# Patient Record
Sex: Female | Born: 1990 | Race: Black or African American | Hispanic: No | Marital: Single | Smoking: Never smoker
Health system: Southern US, Community
[De-identification: ages and names within clinical notes are randomized; demographics above are authoritative.]

## PROBLEM LIST (undated history)

## (undated) DIAGNOSIS — D509 Iron deficiency anemia, unspecified: Secondary | ICD-10-CM

## (undated) DIAGNOSIS — D72819 Decreased white blood cell count, unspecified: Secondary | ICD-10-CM

## (undated) DIAGNOSIS — D649 Anemia, unspecified: Secondary | ICD-10-CM

## (undated) DIAGNOSIS — F419 Anxiety disorder, unspecified: Secondary | ICD-10-CM

## (undated) DIAGNOSIS — Z5189 Encounter for other specified aftercare: Secondary | ICD-10-CM

## (undated) DIAGNOSIS — F32A Depression, unspecified: Secondary | ICD-10-CM

## (undated) DIAGNOSIS — F329 Major depressive disorder, single episode, unspecified: Secondary | ICD-10-CM

## (undated) DIAGNOSIS — A749 Chlamydial infection, unspecified: Secondary | ICD-10-CM

## (undated) HISTORY — DX: Anemia, unspecified: D64.9

## (undated) HISTORY — DX: Depression, unspecified: F32.A

## (undated) HISTORY — DX: Major depressive disorder, single episode, unspecified: F32.9

## (undated) HISTORY — DX: Anxiety disorder, unspecified: F41.9

## (undated) HISTORY — DX: Encounter for other specified aftercare: Z51.89

## (undated) HISTORY — DX: Decreased white blood cell count, unspecified: D72.819

## (undated) HISTORY — DX: Chlamydial infection, unspecified: A74.9

## (undated) HISTORY — DX: Iron deficiency anemia, unspecified: D50.9

---
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
## 2010-06-05 IMAGING — CR DG CHEST 2V
2 series · 2 of 2 positions shown · non-contrast
Comparison: 10/05/2006

CLINICAL DATA: Fever, cough

CHEST - 2 VIEW

[view not recorded (1 of 2)]
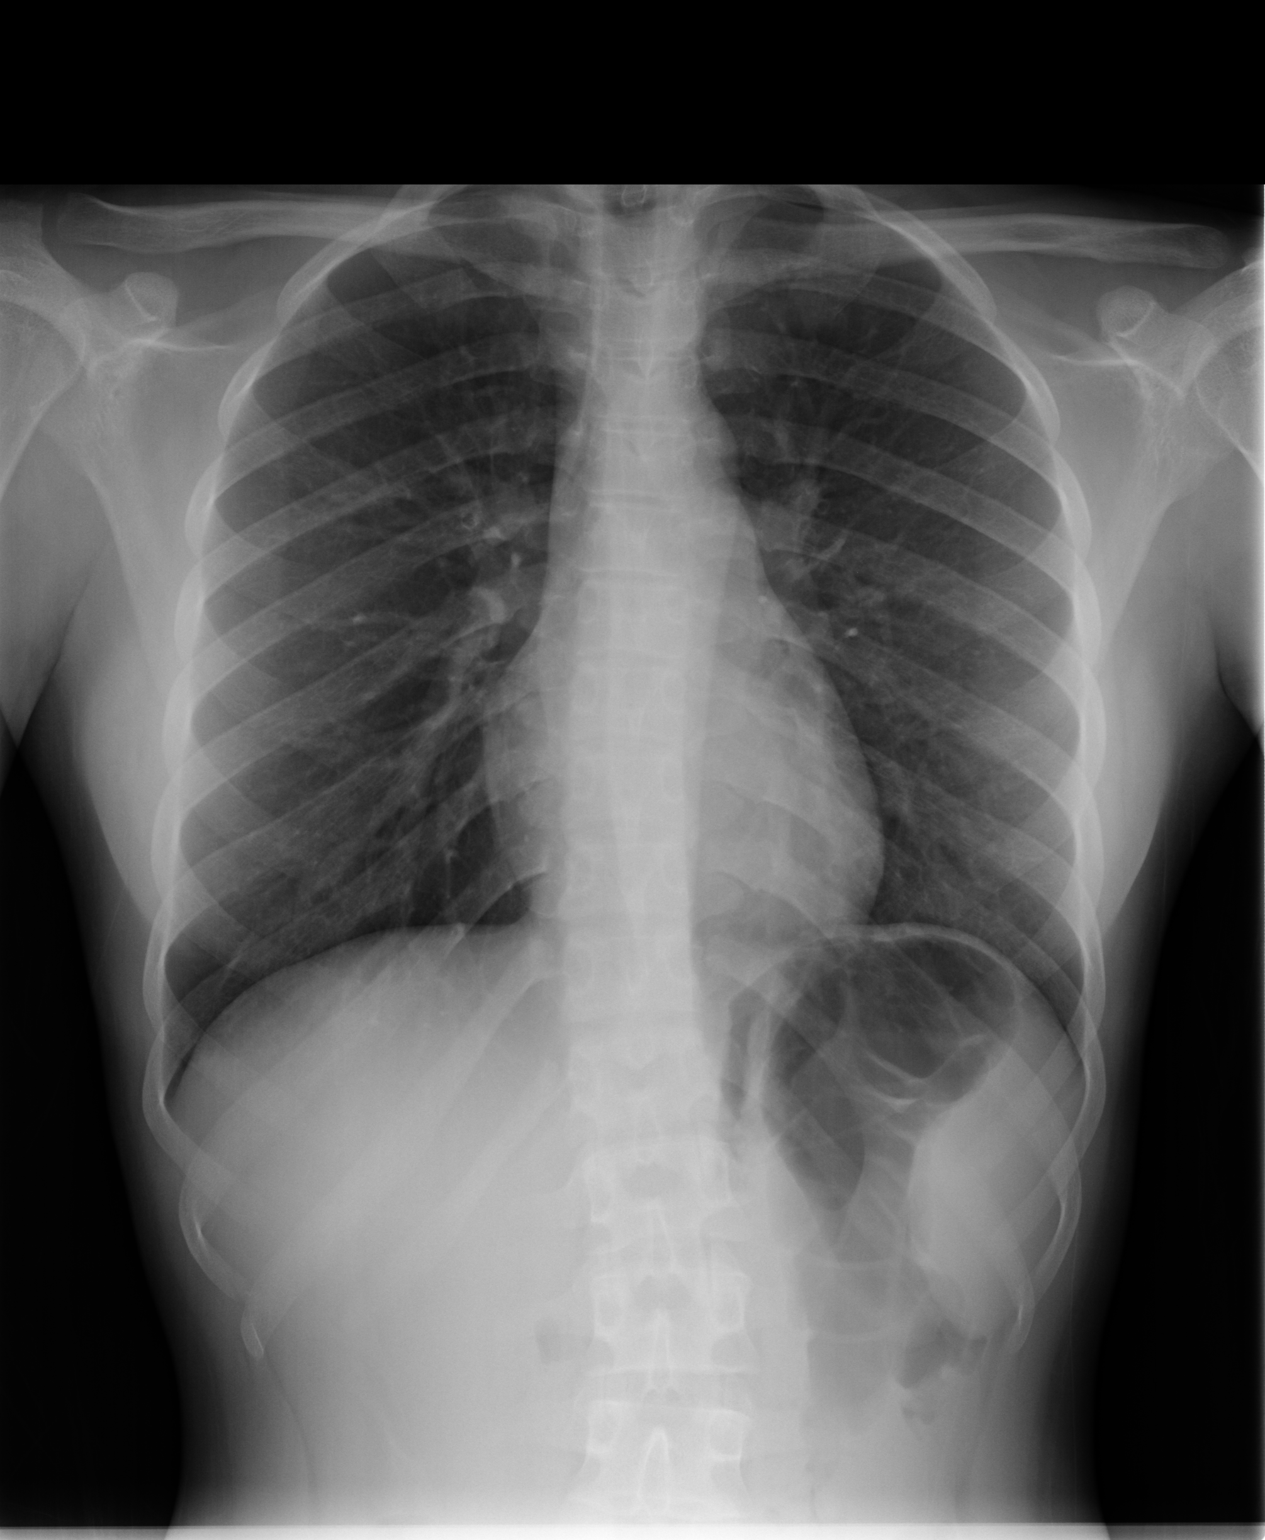

[view not recorded (2 of 2)]
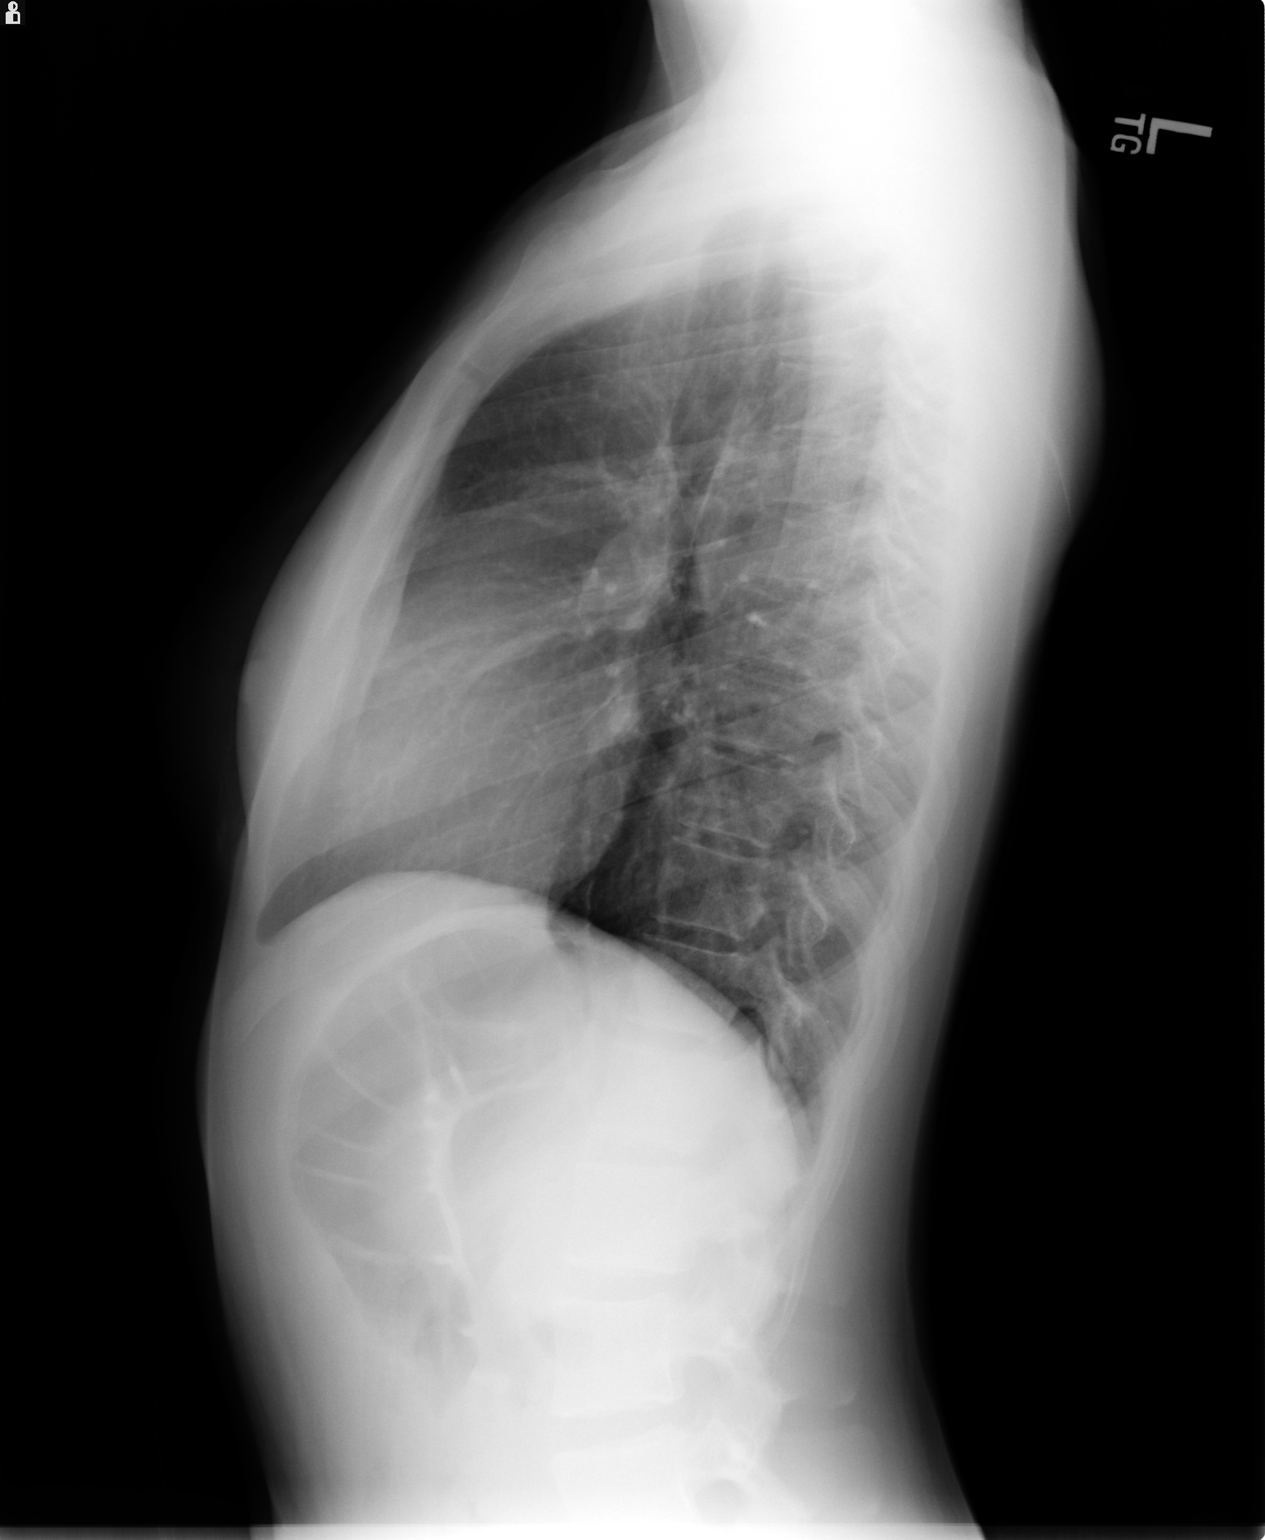

[2 of 2 positions shown; findings below may reference images not displayed]

FINDINGS: The heart size and mediastinal contours are within
normal limits.  Both lungs are clear.  The visualized skeletal
structures are unremarkable. Mild leftward curvature centered at L2
is noted which may be positional.
IMPRESSION: No active cardiopulmonary disease.

## 2011-03-10 ENCOUNTER — Observation Stay (HOSPITAL_COMMUNITY)
Admission: AD | Admit: 2011-03-10 | Discharge: 2011-03-11 | Disposition: A | Discharge status: Home / Self Care | Payer: 59 | Source: Ambulatory Visit | Attending: Family Medicine | Admitting: Family Medicine

## 2011-03-10 DIAGNOSIS — D649 Anemia, unspecified: Principal | ICD-10-CM | POA: Insufficient documentation

## 2011-03-10 DIAGNOSIS — R55 Syncope and collapse: Secondary | ICD-10-CM | POA: Insufficient documentation

## 2011-03-10 LAB — ABO/RH: ABO/RH(D): AB POS

## 2011-03-11 LAB — CROSSMATCH
ABO/RH(D): AB POS
Antibody Screen: NEGATIVE
Unit division: 0
Unit division: 0

## 2011-03-11 LAB — HEMOGLOBIN: Hemoglobin: 10.9 g/dL — ABNORMAL LOW (ref 12.0–15.0)

## 2011-03-12 DIAGNOSIS — IMO0001 Reserved for inherently not codable concepts without codable children: Secondary | ICD-10-CM

## 2011-03-12 DIAGNOSIS — Z5189 Encounter for other specified aftercare: Secondary | ICD-10-CM

## 2011-03-12 HISTORY — DX: Encounter for other specified aftercare: Z51.89

## 2011-03-12 HISTORY — DX: Reserved for inherently not codable concepts without codable children: IMO0001

## 2011-05-09 DIAGNOSIS — A749 Chlamydial infection, unspecified: Secondary | ICD-10-CM

## 2011-05-09 HISTORY — DX: Chlamydial infection, unspecified: A74.9

## 2012-03-22 ENCOUNTER — Telehealth: Payer: Self-pay | Admitting: Obstetrics and Gynecology

## 2012-03-22 ENCOUNTER — Encounter: Payer: Self-pay | Admitting: Obstetrics and Gynecology

## 2012-03-22 ENCOUNTER — Ambulatory Visit
Admission: RE | Admit: 2012-03-22 | Discharge: 2012-03-22 | Disposition: A | Discharge status: Home / Self Care | Payer: BC Managed Care – PPO | Source: Ambulatory Visit | Attending: Obstetrics and Gynecology | Admitting: Obstetrics and Gynecology

## 2012-03-22 ENCOUNTER — Ambulatory Visit (INDEPENDENT_AMBULATORY_CARE_PROVIDER_SITE_OTHER): Payer: BC Managed Care – PPO | Admitting: Obstetrics and Gynecology

## 2012-03-22 ENCOUNTER — Other Ambulatory Visit: Payer: Self-pay

## 2012-03-22 VITALS — BP 98/62 | Resp 16 | Ht 66.0 in | Wt 129.0 lb

## 2012-03-22 DIAGNOSIS — F411 Generalized anxiety disorder: Secondary | ICD-10-CM

## 2012-03-22 DIAGNOSIS — N6310 Unspecified lump in the right breast, unspecified quadrant: Secondary | ICD-10-CM

## 2012-03-22 DIAGNOSIS — N63 Unspecified lump in unspecified breast: Secondary | ICD-10-CM

## 2012-03-22 DIAGNOSIS — F419 Anxiety disorder, unspecified: Secondary | ICD-10-CM | POA: Insufficient documentation

## 2012-03-22 NOTE — Telephone Encounter (Signed)
TC from pt.  Sched  For genetic counsseling 04/20/12

## 2012-03-22 NOTE — Telephone Encounter (Signed)
TC to pt. LM to return call. Spoke with pt earlier. Agrees to genetic counseling for BRCA1 and 2.  Info given.  To schedule appt.

## 2012-03-22 NOTE — Progress Notes (Signed)
Pt c/o R breast lump since this weekend. No pain no leaking fluids per pt     S: Patient presented with hx of finding a breast lump while showering this weekend. Anxious as her mother's sister had Breast Cancer at aged 21 yrs. O: Patient has a nervous disposition and has had a life experience of her close best friend dying in a MVA 2 years ago. She has attended [facility]iatric [facility]      Taking Zoloft 50mg s po daily for depression since this event. Had attended the Dermatologist for dermatitis in the past and has been taking Vistaril 50mg s po       Daily.      Breast Examination: Visually both breast assymetrial  In appearance, No dimpling or drainage.      Examination Left Breast - negative      Examination Right Breast - 2cm lump circular in appearance on palpation  inferio- lateral to nipple Hard and mobile - non tender.      Lungs: Bilaterally Clear      CV: RRR      Abdomen: Soft and non tender all quadrants.      No other complaints. P: Patient ressured     Appt for [facility] today at 12.30hrs     [facility] Testing f/u with Harriett Sine in August.     F/U PRN     Earl Gala, CNM.

## 2012-04-20 ENCOUNTER — Ambulatory Visit: Payer: BC Managed Care – PPO

## 2012-04-20 ENCOUNTER — Telehealth: Payer: Self-pay | Admitting: Obstetrics and Gynecology

## 2012-04-20 NOTE — Telephone Encounter (Signed)
TC to pt. States forgot appt for genetic counseling.  Will call to R/S when has her calendar available.

## 2012-12-17 ENCOUNTER — Encounter (HOSPITAL_COMMUNITY): Payer: Self-pay | Admitting: Emergency Medicine

## 2012-12-17 ENCOUNTER — Other Ambulatory Visit: Payer: Self-pay | Admitting: Oncology

## 2012-12-17 ENCOUNTER — Emergency Department (HOSPITAL_COMMUNITY)
Admission: EM | Admit: 2012-12-17 | Discharge: 2012-12-18 | Disposition: A | Discharge status: Home / Self Care | Payer: BC Managed Care – PPO | Attending: Emergency Medicine | Admitting: Emergency Medicine

## 2012-12-17 DIAGNOSIS — Z79899 Other long term (current) drug therapy: Secondary | ICD-10-CM | POA: Insufficient documentation

## 2012-12-17 DIAGNOSIS — D649 Anemia, unspecified: Secondary | ICD-10-CM | POA: Insufficient documentation

## 2012-12-17 DIAGNOSIS — Z8619 Personal history of other infectious and parasitic diseases: Secondary | ICD-10-CM | POA: Insufficient documentation

## 2012-12-17 LAB — COMPREHENSIVE METABOLIC PANEL
ALT: 8 U/L (ref 0–35)
AST: 13 U/L (ref 0–37)
Albumin: 4.1 g/dL (ref 3.5–5.2)
CO2: 23 mEq/L (ref 19–32)
Chloride: 105 mEq/L (ref 96–112)
Creatinine, Ser: 0.8 mg/dL (ref 0.50–1.10)
GFR calc non Af Amer: >90 mL/min (ref >90)
Sodium: 138 mEq/L (ref 135–145)
Total Bilirubin: 0.3 mg/dL (ref 0.3–1.2)

## 2012-12-17 LAB — CBC
Platelets: 1255 10*3/uL (ref 150–400)
RBC: 3.63 MIL/uL — ABNORMAL LOW (ref 3.87–5.11)
RDW: 27 % — ABNORMAL HIGH (ref 11.5–15.5)
WBC: 5.3 10*3/uL (ref 4.0–10.5)

## 2012-12-17 MED ORDER — SODIUM CHLORIDE 0.9 % IJ SOLN: 3.0000 mL | INTRAMUSCULAR | Status: DC | PRN

## 2012-12-17 MED ORDER — SODIUM CHLORIDE 0.9 % IJ SOLN: 3.0000 mL | Freq: Two times a day (BID) | INTRAMUSCULAR | Status: DC

## 2012-12-17 MED ORDER — SODIUM CHLORIDE 0.9 % IV SOLN: 250.0000 mL | INTRAVENOUS | Status: DC | PRN

## 2012-12-17 NOTE — ED Notes (Signed)
Attempted iv x 1 without success; iv team paged

## 2012-12-17 NOTE — ED Provider Notes (Signed)
History     CSN: [id number]  Arrival date & time 12/17/12  1737   First MD Initiated Contact with Patient 12/17/12 1856      CC: Anemia  HPI Pt has history of anemia.    She thinks she normally runs at 10 or so but is not sure.  She does not see a hematologist but does have history of anemia that required a blood transfusion previously.  She is not sure what type of anemia she has. Her mother also has anemia. No history of heavy menses.  No blood in her stool.  She saw her gynecologist and had a blood test routinely last week and was called today and told to come to the ED.  No blood in her stool.  No melena.  No history of heavy menses.  Past Medical History  Diagnosis Date  . Anemia   . Chlamydia infection 05/09/11  . Blood transfusion 03/12/11    History reviewed. No pertinent past surgical history.  No family history on file.  History  Substance Use Topics  . Smoking status: Never Smoker   . Smokeless tobacco: Never Used  . Alcohol Use: 0.5 oz/week    1 drink(s) per week    OB History   Grav Para Term Preterm Abortions TAB SAB Ect Mult Living                  Review of Systems  All other systems reviewed and are negative.    Allergies  Review of patient's allergies indicates no known allergies.  Home Medications   Current Outpatient Rx  Name  Route  Sig  Dispense  Refill  . buPROPion (WELLBUTRIN SR) 150 MG 12 hr tablet   Oral   Take 450 mg by mouth every morning.         . busPIRone (BUSPAR) 10 MG tablet   Oral   Take 10 mg by mouth at bedtime.         . hydrOXYzine (VISTARIL) 25 MG capsule   Oral   Take 25 mg by mouth 3 (three) times daily as needed for itching.            BP 122/61  Pulse 85  Temp(Src) 98.8 F (37.1 C) (Oral)  Resp 14  SpO2 100%  LMP 11/30/2012  Physical Exam  Nursing note and vitals reviewed. Constitutional: She appears well-developed and well-nourished. No distress.  HENT:  Head: Normocephalic and atraumatic.   Right Ear: External ear normal.  Left Ear: External ear normal.  Eyes: Conjunctivae are normal. Right eye exhibits no discharge. Left eye exhibits no discharge. No scleral icterus.  Pale conjunctiva  Neck: Neck supple. No tracheal deviation present.  Cardiovascular: Normal rate, regular rhythm and intact distal pulses.   Pulmonary/Chest: Effort normal and breath sounds normal. No stridor. No respiratory distress. She has no wheezes. She has no rales.  Abdominal: Soft. Bowel sounds are normal. She exhibits no distension. There is no tenderness. There is no rebound and no guarding.  Musculoskeletal: She exhibits no edema and no tenderness.  Neurological: She is alert. She has normal strength. No sensory deficit. Cranial nerve deficit:  no gross defecits noted. She exhibits normal muscle tone. She displays no seizure activity. Coordination normal.  Skin: Skin is warm and dry. No rash noted.  Psychiatric: She has a normal mood and affect.    ED Course  Procedures (including critical care time)  Labs Reviewed  CBC - Abnormal; Notable for the following:  RBC 3.63 (*)    Hemoglobin 6.3 (*)    HCT 22.9 (*)    MCV 63.1 (*)    MCH 17.4 (*)    MCHC 27.5 (*)    RDW 27.0 (*)    Platelets 1255 (*)    All other components within normal limits  RETICULOCYTES - Abnormal; Notable for the following:    RBC. 3.53 (*)    All other components within normal limits  IRON AND TIBC - Abnormal; Notable for the following:    Iron 10 (*)    TIBC 471 (*)    Saturation Ratios 2 (*)    UIBC 461 (*)    All other components within normal limits  FERRITIN - Abnormal; Notable for the following:    Ferritin 1 (*)    All other components within normal limits  COMPREHENSIVE METABOLIC PANEL  LACTATE DEHYDROGENASE  VITAMIN B12  FOLATE  PATHOLOGIST SMEAR REVIEW  TYPE AND SCREEN  PREPARE RBC (CROSSMATCH)   No results found.   1. Anemia       MDM  Pt has very few symptoms despite her degree of  anemia.  She was given a blood transfusion in the ED.  Case was discussed with Dr Gaylyn Rong and she will follow up in the hematology office for further evaluation of her anemia.        Celene Kras, MD 12/18/12 [phone]

## 2012-12-17 NOTE — ED Notes (Signed)
Dr Linwood Dibbles made aware of hgb 6.3; platelet 1255; no orders given

## 2012-12-17 NOTE — ED Notes (Signed)
Pt here from gynecologist after she was told she had low hemoglobin. Pt reports she feels more tired than normal. Periods have been normal.

## 2012-12-18 ENCOUNTER — Telehealth: Payer: Self-pay | Admitting: Oncology

## 2012-12-18 LAB — FERRITIN: Ferritin: 1 ng/mL — ABNORMAL LOW (ref 10–291)

## 2012-12-18 LAB — RETICULOCYTES
RBC.: 3.53 MIL/uL — ABNORMAL LOW (ref 3.87–5.11)
Retic Ct Pct: 0.9 % (ref 0.4–3.1)

## 2012-12-18 LAB — PATHOLOGIST SMEAR REVIEW

## 2012-12-18 LAB — IRON AND TIBC
Saturation Ratios: 2 % — ABNORMAL LOW (ref 20–55)
TIBC: 471 ug/dL — ABNORMAL HIGH (ref 250–470)
UIBC: 461 ug/dL — ABNORMAL HIGH (ref 125–400)

## 2012-12-18 LAB — FOLATE: Folate: 10.3 ng/mL

## 2012-12-18 NOTE — ED Notes (Signed)
Pt concerned over the numbness in her arm and slight pain in her chest.  Lynelle Doctor, MD made aware and reassured pt of risks and benefits of blood transfusion.

## 2012-12-18 NOTE — Telephone Encounter (Signed)
S/W PT IN RE TO NP APPT 04/25 @ 12:15 W/DR. HA.

## 2012-12-18 NOTE — ED Notes (Signed)
Unable to draw labs due to patient receiving blood at this time.  

## 2012-12-20 ENCOUNTER — Other Ambulatory Visit: Payer: Self-pay | Admitting: Oncology

## 2012-12-20 DIAGNOSIS — D509 Iron deficiency anemia, unspecified: Secondary | ICD-10-CM

## 2012-12-21 ENCOUNTER — Telehealth: Payer: Self-pay | Admitting: Oncology

## 2012-12-21 ENCOUNTER — Ambulatory Visit (HOSPITAL_BASED_OUTPATIENT_CLINIC_OR_DEPARTMENT_OTHER): Payer: BC Managed Care – PPO | Admitting: Oncology

## 2012-12-21 ENCOUNTER — Ambulatory Visit (HOSPITAL_BASED_OUTPATIENT_CLINIC_OR_DEPARTMENT_OTHER): Payer: BC Managed Care – PPO

## 2012-12-21 ENCOUNTER — Ambulatory Visit: Payer: BC Managed Care – PPO

## 2012-12-21 ENCOUNTER — Encounter: Payer: Self-pay | Admitting: Oncology

## 2012-12-21 ENCOUNTER — Other Ambulatory Visit (HOSPITAL_BASED_OUTPATIENT_CLINIC_OR_DEPARTMENT_OTHER): Payer: BC Managed Care – PPO

## 2012-12-21 VITALS — BP 108/69 | HR 99 | Temp 98.9°F | Resp 17 | Ht 67.5 in | Wt 119.6 lb

## 2012-12-21 VITALS — BP 104/69 | HR 105 | Temp 98.4°F

## 2012-12-21 DIAGNOSIS — R5383 Other fatigue: Secondary | ICD-10-CM

## 2012-12-21 DIAGNOSIS — D509 Iron deficiency anemia, unspecified: Secondary | ICD-10-CM

## 2012-12-21 DIAGNOSIS — N920 Excessive and frequent menstruation with regular cycle: Secondary | ICD-10-CM

## 2012-12-21 LAB — TYPE AND SCREEN
Antibody Screen: NEGATIVE
Unit division: 0

## 2012-12-21 LAB — CBC WITH DIFFERENTIAL/PLATELET
BASO%: 1.1 % (ref 0.0–2.0)
EOS%: 1.9 % (ref 0.0–7.0)
MCH: 20.8 pg — ABNORMAL LOW (ref 25.1–34.0)
MCHC: 32.6 g/dL (ref 31.5–36.0)
MONO#: 0.7 10*3/uL (ref 0.1–0.9)
NEUT%: 50.7 % (ref 38.4–76.8)
RBC: 3.44 10*6/uL — ABNORMAL LOW (ref 3.70–5.45)
RDW: 33.5 % — ABNORMAL HIGH (ref 11.2–14.5)
WBC: 4.7 10*3/uL (ref 3.9–10.3)
lymph#: 1.5 10*3/uL (ref 0.9–3.3)

## 2012-12-21 LAB — TECHNOLOGIST REVIEW

## 2012-12-21 LAB — HOLD TUBE, BLOOD BANK

## 2012-12-21 MED ORDER — ACETAMINOPHEN 325 MG PO TABS
650.0000 mg | ORAL_TABLET | Freq: Once | ORAL | Status: AC
  Administered 2012-12-21: 650 mg via ORAL

## 2012-12-21 MED ORDER — SODIUM CHLORIDE 0.9 % IV SOLN
Freq: Once | INTRAVENOUS | Status: AC
  Administered 2012-12-21: 15:00:00 via INTRAVENOUS

## 2012-12-21 MED ORDER — DIPHENHYDRAMINE HCL 25 MG PO TABS
25.0000 mg | ORAL_TABLET | Freq: Once | ORAL | Status: AC
  Administered 2012-12-21: 25 mg via ORAL
  Filled 2012-12-21: qty 1

## 2012-12-21 MED ORDER — SODIUM CHLORIDE 0.9 % IV SOLN
1020.0000 mg | Freq: Once | INTRAVENOUS | Status: AC
  Administered 2012-12-21: 1020 mg via INTRAVENOUS
  Filled 2012-12-21: qty 34

## 2012-12-21 NOTE — Progress Notes (Signed)
[facility]  [phone] [phone]     INITIAL HEMATOLOGY CONSULTATION    Referral MD: Isidoro Donning. Pecola Leisure, M.D.  Reason for Referral: microcytic anemia.     HPI:  Brianna [name] is a 22 year-old woman with no significant PMH.  She had routine CBC by Dr. Dierdre Forth which showed severe anemia.  She was referred to ED.  She received one unit of pRBC last week.  She was kindly referred to the [facility] for evaluation.   Ms. Gaskins presented to the clinic today for the first time with her mother.  She reported presyncopal; fatigue; SOB; x 3 months. She was taking oral iron in the past without side effects; however, it was unclear why she went off of oral iron a year ago. She has regular menstrual cycle lasting 3 days each cycle.  On the first day is heavy when she changes pad/tampon q3hr.  She has had ice pica for years. She denied anorexia, weight loss, severe fatigue, mucositis, abdominal pain, visible source of bleeding.  The rest of the 14-point review of system was negative.     Past Medical History  Diagnosis Date  . Anemia     since puberty.   . Chlamydia infection 05/09/11  . Blood transfusion 03/12/11  :    No past surgical history on file.:   CURRENT MEDS: Current Outpatient Prescriptions  Medication Sig Dispense Refill  . buPROPion (WELLBUTRIN SR) 150 MG 12 hr tablet Take 450 mg by mouth every morning.      . busPIRone (BUSPAR) 10 MG tablet Take 10 mg by mouth at bedtime.      . hydrOXYzine (VISTARIL) 25 MG capsule Take 25 mg by mouth 3 (three) times daily as needed for itching.        No current facility-administered medications for this visit.      No Known Allergies:  Family History  Problem Relation Age of Onset  . Iron deficiency Mother   . Cancer Mother     sarcoma  :  History   Social History  . Marital Status: Single    Spouse Name: N/A    Number of Children: 0  . Years of Education: N/A    Occupational History  .      school; business   Social History Main Topics  . Smoking status: Never Smoker   . Smokeless tobacco: Never Used  . Alcohol Use: 0.5 oz/week    1 drink(s) per week  . Drug Use: No  . Sexually Active: Yes -- Female partner(s)    Birth Control/ Protection: None   Other Topics Concern  . Not on file   Social History Narrative  . No narrative on file  :  Exam: ECOG 0-1  General:  Thin-appearing woman, in no acute distress.  Eyes:  no scleral icterus.  ENT:  There were no oropharyngeal lesions.  Neck was without thyromegaly.  Lymphatics:  Negative cervical, supraclavicular or axillary adenopathy.  Respiratory: lungs were clear bilaterally without wheezing or crackles.  Cardiovascular:  Regular rate and rhythm, S1/S2, without murmur, rub or gallop.  There was no pedal edema.  GI:  abdomen was soft, flat, nontender, nondistended, without organomegaly.  Stool was brown; Guaiac hem negative.  Muscoloskeletal:  no spinal tenderness of palpation of vertebral spine.  Skin exam was without echymosis, petichae.  Neuro exam was nonfocal.  Patient was able to get on and off exam table without assistance.  Gait was  normal.  Patient was alerted and oriented.  Attention was good.   Language was appropriate.  Mood was normal without depression.  Speech was not pressured.  Thought content was not tangential.    LABS:  Lab Results  Component Value Date   WBC 4.7 12/21/2012   HGB 7.1* 12/21/2012   HCT 21.9* 12/21/2012   PLT 804* 12/21/2012   GLUCOSE 78 12/17/2012   ALT 8 12/17/2012   AST 13 12/17/2012   NA 138 12/17/2012   K 3.6 12/17/2012   CL 105 12/17/2012   CREATININE 0.80 12/17/2012   BUN 9 12/17/2012   CO2 23 12/17/2012    Blood smear review:   I personally reviewed the patient's peripheral blood smear today.  There was anisocytosis.  There was no peripheral blast.  There was increased central pallor.  There was no schistocytosis, spherocytosis, target cell, rouleaux  formation, tear drop cell.  There was no giant platelets or platelet clumps.      ASSESSMENT AND PLAN:   1.  Diagnosis:  Iron deficiency anemia. 2.  Potential causes:  Menstrual blood loss most likely. 3.  Treatment:  *  Start oral iron over the counter twice daily along with VitC or orange juice to increase absorption.  *  IV Iron Feraheme to increase iron storage quickly.  Potential side effects of Feraheme include but not limited to infusion reaction, bone pain for a few days.  She expressed informed understanding and wished to receive IV iron today.   4.  Follow up:  Weekly blood check to ensure Hgb >8; then monthly blood check.  Return visit in about 6 months.  Consider starting oral contraceptive per instruction by Gyn if there is heavy period.   The length of time of the face-to-face encounter was 30 minutes. More than 50% of time was spent counseling and coordination of care.     Thank you for this referral.      Randle Shatzer T. Gaylyn Rong, M.D.

## 2012-12-21 NOTE — Progress Notes (Signed)
Checked in new pt with no financial concerns. °

## 2012-12-21 NOTE — Patient Instructions (Addendum)
1.  Diagnosis:  Iron deficiency anemia. 2.  Potential causes:  Menstrual blood loss most likely. 3.  Treatment:  *  Start oral iron over the counter twice daily along with VitC or orange juice to increase absorption.  *  IV Iron Feraheme to increase iron storage quickly.  Potential side effects of Feraheme include but not limited to infusion reaction, bone pain for a few days.  4.  Follow up:  Weekly blood check to ensure Hgb >8; then monthly blood check.  Return visit in about 6 months.  Consider starting oral contraceptive per instruction by Gyn if there is heavy period.

## 2012-12-21 NOTE — Patient Instructions (Signed)
Ferumoxytol injection What is this medicine? FERUMOXYTOL is an iron complex. Iron is used to make healthy red blood cells, which carry oxygen and nutrients throughout the body. This medicine is used to treat iron deficiency anemia in people with chronic kidney disease. This medicine may be used for other purposes; ask your health care provider or pharmacist if you have questions. What should I tell my health care provider before I take this medicine? They need to know if you have any of these conditions: -anemia not caused by low iron levels -high levels of iron in the blood -magnetic resonance imaging (MRI) test scheduled -an unusual or allergic reaction to iron, other medicines, foods, dyes, or preservatives -pregnant or trying to get pregnant -breast-feeding How should I use this medicine? This medicine is for infusion into a vein. It is given by a health care professional in a hospital or clinic setting. Talk to your pediatrician regarding the use of this medicine in children. Special care may be needed. Overdosage: If you think you've taken too much of this medicine contact a poison control center or emergency room at once. Overdosage: If you think you have taken too much of this medicine contact a poison control center or emergency room at once. NOTE: This medicine is only for you. Do not share this medicine with others. What if I miss a dose? It is important not to miss your dose. Call your doctor or health care professional if you are unable to keep an appointment. What may interact with this medicine? This medicine may interact with the following medications: -other iron products This list may not describe all possible interactions. Give your health care provider a list of all the medicines, herbs, non-prescription drugs, or dietary supplements you use. Also tell them if you smoke, drink alcohol, or use illegal drugs. Some items may interact with your medicine. What should I watch  for while using this medicine? Visit your doctor or healthcare professional regularly. Tell your doctor or healthcare professional if your symptoms do not start to get better or if they get worse. You may need blood work done while you are taking this medicine. You may need to follow a special diet. Talk to your doctor. Foods that contain iron include: whole grains/cereals, dried fruits, beans, or peas, leafy green vegetables, and organ meats (liver, kidney). What side effects may I notice from receiving this medicine? Side effects that you should report to your doctor or health care professional as soon as possible: -allergic reactions like skin rash, itching or hives, swelling of the face, lips, or tongue -breathing problems -changes in blood pressure -feeling faint or lightheaded, falls -fever or chills -flushing, sweating, or hot feelings -swelling of the ankles or feet Side effects that usually do not require medical attention (Report these to your doctor or health care professional if they continue or are bothersome.): -diarrhea -headache -nausea, vomiting -stomach pain This list may not describe all possible side effects. Call your doctor for medical advice about side effects. You may report side effects to FDA at 1-800-FDA-1088. Where should I keep my medicine? This drug is given in a hospital or clinic and will not be stored at home. NOTE: This sheet is a summary. It may not cover all possible information. If you have questions about this medicine, talk to your doctor, pharmacist, or health care provider.  2012, [employer]vier/Gold Standard. (05/07/2008 9:48:25 PM) 

## 2012-12-28 ENCOUNTER — Other Ambulatory Visit (HOSPITAL_BASED_OUTPATIENT_CLINIC_OR_DEPARTMENT_OTHER): Payer: BC Managed Care – PPO | Admitting: Lab

## 2012-12-28 DIAGNOSIS — D509 Iron deficiency anemia, unspecified: Secondary | ICD-10-CM

## 2012-12-28 LAB — CBC WITH DIFFERENTIAL/PLATELET
BASO%: 2.4 % — ABNORMAL HIGH (ref 0.0–2.0)
LYMPH%: 43.4 % (ref 14.0–49.7)
MCHC: 31.3 g/dL — ABNORMAL LOW (ref 31.5–36.0)
MONO#: 0.4 10*3/uL (ref 0.1–0.9)
Platelets: 239 10*3/uL (ref 145–400)
RBC: 4.05 10*6/uL (ref 3.70–5.45)
WBC: 3.6 10*3/uL — ABNORMAL LOW (ref 3.9–10.3)

## 2013-01-04 ENCOUNTER — Other Ambulatory Visit: Payer: BC Managed Care – PPO

## 2013-01-11 ENCOUNTER — Other Ambulatory Visit: Payer: BC Managed Care – PPO | Admitting: Lab

## 2013-01-15 ENCOUNTER — Telehealth: Payer: Self-pay | Admitting: Oncology

## 2013-01-16 ENCOUNTER — Other Ambulatory Visit (HOSPITAL_BASED_OUTPATIENT_CLINIC_OR_DEPARTMENT_OTHER): Payer: BC Managed Care – PPO | Admitting: Lab

## 2013-01-16 DIAGNOSIS — D509 Iron deficiency anemia, unspecified: Secondary | ICD-10-CM

## 2013-01-16 LAB — CBC WITH DIFFERENTIAL/PLATELET
BASO%: 0.8 % (ref 0.0–2.0)
HCT: 38.7 % (ref 34.8–46.6)
MCHC: 32.6 g/dL (ref 31.5–36.0)
MONO#: 0.5 10*3/uL (ref 0.1–0.9)
NEUT%: 52 % (ref 38.4–76.8)
RBC: 4.73 10*6/uL (ref 3.70–5.45)
WBC: 5.7 10*3/uL (ref 3.9–10.3)
lymph#: 2.1 10*3/uL (ref 0.9–3.3)

## 2013-01-18 ENCOUNTER — Other Ambulatory Visit: Payer: BC Managed Care – PPO

## 2013-02-18 ENCOUNTER — Other Ambulatory Visit: Payer: BC Managed Care – PPO | Admitting: Lab

## 2013-02-21 ENCOUNTER — Other Ambulatory Visit: Payer: Self-pay | Admitting: *Deleted

## 2013-02-22 ENCOUNTER — Telehealth: Payer: Self-pay | Admitting: Oncology

## 2013-02-22 NOTE — Telephone Encounter (Signed)
no vm set up mailed pt new avs...and letter

## 2013-02-27 ENCOUNTER — Other Ambulatory Visit (HOSPITAL_BASED_OUTPATIENT_CLINIC_OR_DEPARTMENT_OTHER): Payer: BC Managed Care – PPO | Admitting: Lab

## 2013-02-27 DIAGNOSIS — D509 Iron deficiency anemia, unspecified: Secondary | ICD-10-CM

## 2013-02-27 LAB — CBC WITH DIFFERENTIAL/PLATELET
Basophils Absolute: 0 10*3/uL (ref 0.0–0.1)
Eosinophils Absolute: 0 10*3/uL (ref 0.0–0.5)
HCT: 37.5 % (ref 34.8–46.6)
HGB: 12.6 g/dL (ref 11.6–15.9)
MONO#: 0.6 10*3/uL (ref 0.1–0.9)
NEUT#: 4.8 10*3/uL (ref 1.5–6.5)
NEUT%: 65.5 % (ref 38.4–76.8)
WBC: 7.4 10*3/uL (ref 3.9–10.3)
lymph#: 1.9 10*3/uL (ref 0.9–3.3)

## 2013-02-28 LAB — IRON AND TIBC CHCC
%SAT: 11 % — ABNORMAL LOW (ref 21–57)
Iron: 44 ug/dL (ref 41–142)
UIBC: 366 ug/dL (ref 120–384)

## 2013-03-20 ENCOUNTER — Other Ambulatory Visit (HOSPITAL_BASED_OUTPATIENT_CLINIC_OR_DEPARTMENT_OTHER): Payer: BC Managed Care – PPO | Admitting: Lab

## 2013-03-20 ENCOUNTER — Telehealth: Payer: Self-pay | Admitting: *Deleted

## 2013-03-20 DIAGNOSIS — D509 Iron deficiency anemia, unspecified: Secondary | ICD-10-CM

## 2013-03-20 LAB — CBC WITH DIFFERENTIAL/PLATELET
BASO%: 0.9 % (ref 0.0–2.0)
EOS%: 1 % (ref 0.0–7.0)
HCT: 39.3 % (ref 34.8–46.6)
LYMPH%: 50.1 % — ABNORMAL HIGH (ref 14.0–49.7)
MCH: 29.2 pg (ref 25.1–34.0)
MCHC: 33.6 g/dL (ref 31.5–36.0)
NEUT%: 36.6 % — ABNORMAL LOW (ref 38.4–76.8)
lymph#: 1.8 10*3/uL (ref 0.9–3.3)

## 2013-03-20 NOTE — Telephone Encounter (Signed)
Message copied by Wende Mott on Wed Mar 20, 2013  4:15 PM ------      Message from: Clenton Pare R      Created: Wed Mar 20, 2013  3:21 PM       Please call pt. Hgb remains normal. Continue observation. ------

## 2013-03-20 NOTE — Telephone Encounter (Signed)
Informed pt of Kristin's message below and to keep lab appt next month as scheduled.  She verbalized understanding.

## 2013-04-19 ENCOUNTER — Other Ambulatory Visit (HOSPITAL_BASED_OUTPATIENT_CLINIC_OR_DEPARTMENT_OTHER): Payer: BC Managed Care – PPO

## 2013-04-19 DIAGNOSIS — D509 Iron deficiency anemia, unspecified: Secondary | ICD-10-CM

## 2013-04-19 LAB — CBC WITH DIFFERENTIAL/PLATELET
BASO%: 0.5 % (ref 0.0–2.0)
EOS%: 3.2 % (ref 0.0–7.0)
HGB: 13.2 g/dL (ref 11.6–15.9)
MCH: 28.3 pg (ref 25.1–34.0)
MCHC: 33.3 g/dL (ref 31.5–36.0)
RBC: 4.66 10*6/uL (ref 3.70–5.45)
RDW: 14 % (ref 11.2–14.5)
lymph#: 1.6 10*3/uL (ref 0.9–3.3)

## 2013-04-19 LAB — IRON AND TIBC CHCC
Iron: 123 ug/dL (ref 41–142)
TIBC: 378 ug/dL (ref 236–444)
UIBC: 255 ug/dL (ref 120–384)

## 2013-04-22 ENCOUNTER — Telehealth: Payer: Self-pay | Admitting: *Deleted

## 2013-04-22 NOTE — Telephone Encounter (Signed)
Spoke with patient, let her know her hemoglobin is stable. Keep regularly scheduled appt.

## 2013-04-22 NOTE — Telephone Encounter (Signed)
Message copied by Reesa Chew on Mon Apr 22, 2013  4:08 PM ------      Message from: Su Hilt C      Created: Mon Apr 22, 2013 11:48 AM                   ----- Message -----         From: Myrtis Ser, NP         Sent: 04/19/2013   4:35 PM           To: Marcell Barlow, RN, #            Please call pt. Hgb remains normal. Continue observation. ------

## 2013-05-15 ENCOUNTER — Telehealth: Payer: Self-pay | Admitting: *Deleted

## 2013-05-15 NOTE — Telephone Encounter (Signed)
Pt called to cancel her labs for 9/22 and to r/s for for 9/18 because she will be out of town. gv lab appt for 9/18 @ 2pm. Pt is aware...td

## 2013-05-16 ENCOUNTER — Other Ambulatory Visit (HOSPITAL_BASED_OUTPATIENT_CLINIC_OR_DEPARTMENT_OTHER): Payer: BC Managed Care – PPO | Admitting: Lab

## 2013-05-16 ENCOUNTER — Other Ambulatory Visit: Payer: Self-pay | Admitting: *Deleted

## 2013-05-16 DIAGNOSIS — D509 Iron deficiency anemia, unspecified: Secondary | ICD-10-CM

## 2013-05-16 LAB — CBC WITH DIFFERENTIAL/PLATELET
BASO%: 1 % (ref 0.0–2.0)
LYMPH%: 47.2 % (ref 14.0–49.7)
MCHC: 32.4 g/dL (ref 31.5–36.0)
MCV: 88.3 fL (ref 79.5–101.0)
MONO%: 13.9 % (ref 0.0–14.0)
Platelets: 242 10*3/uL (ref 145–400)
RBC: 4.53 10*6/uL (ref 3.70–5.45)

## 2013-05-20 ENCOUNTER — Other Ambulatory Visit: Payer: BC Managed Care – PPO

## 2013-06-18 ENCOUNTER — Telehealth: Payer: Self-pay | Admitting: Hematology and Oncology

## 2013-06-18 NOTE — Telephone Encounter (Signed)
Moved 10/24 appt to 10/30 lb/NG. S/w pt she is aware.

## 2013-06-19 ENCOUNTER — Other Ambulatory Visit: Payer: BC Managed Care – PPO

## 2013-06-21 ENCOUNTER — Ambulatory Visit: Payer: BC Managed Care – PPO

## 2013-06-21 ENCOUNTER — Other Ambulatory Visit: Payer: BC Managed Care – PPO | Admitting: Lab

## 2013-06-27 ENCOUNTER — Ambulatory Visit (HOSPITAL_BASED_OUTPATIENT_CLINIC_OR_DEPARTMENT_OTHER): Payer: BC Managed Care – PPO | Admitting: Hematology and Oncology

## 2013-06-27 ENCOUNTER — Other Ambulatory Visit: Payer: Self-pay | Admitting: Hematology and Oncology

## 2013-06-27 ENCOUNTER — Telehealth: Payer: Self-pay | Admitting: Hematology and Oncology

## 2013-06-27 ENCOUNTER — Other Ambulatory Visit (HOSPITAL_BASED_OUTPATIENT_CLINIC_OR_DEPARTMENT_OTHER): Payer: BC Managed Care – PPO | Admitting: Lab

## 2013-06-27 ENCOUNTER — Encounter: Payer: Self-pay | Admitting: Hematology and Oncology

## 2013-06-27 VITALS — BP 105/60 | HR 94 | Temp 97.6°F | Resp 18 | Ht 67.5 in | Wt 132.8 lb

## 2013-06-27 DIAGNOSIS — D509 Iron deficiency anemia, unspecified: Secondary | ICD-10-CM

## 2013-06-27 DIAGNOSIS — D72819 Decreased white blood cell count, unspecified: Secondary | ICD-10-CM | POA: Insufficient documentation

## 2013-06-27 HISTORY — DX: Decreased white blood cell count, unspecified: D72.819

## 2013-06-27 LAB — CBC & DIFF AND RETIC
BASO%: 0.6 % (ref 0.0–2.0)
Basophils Absolute: 0 10*3/uL (ref 0.0–0.1)
EOS%: 0.9 % (ref 0.0–7.0)
Eosinophils Absolute: 0 10*3/uL (ref 0.0–0.5)
HGB: 12.5 g/dL (ref 11.6–15.9)
MCHC: 32.9 g/dL (ref 31.5–36.0)
MCV: 85.2 fL (ref 79.5–101.0)
MONO%: 8.5 % (ref 0.0–14.0)
NEUT#: 1.2 10*3/uL — ABNORMAL LOW (ref 1.5–6.5)
RBC: 4.46 10*6/uL (ref 3.70–5.45)
RDW: 14 % (ref 11.2–14.5)
Retic %: 0.85 % (ref 0.70–2.10)
Retic Ct Abs: 37.91 10*3/uL (ref 33.70–90.70)
lymph#: 1.6 10*3/uL (ref 0.9–3.3)

## 2013-06-27 LAB — VITAMIN B12: Vitamin B-12: 595 pg/mL (ref 211–911)

## 2013-06-27 LAB — IRON AND TIBC CHCC: TIBC: 435 ug/dL (ref 236–444)

## 2013-06-27 LAB — FERRITIN CHCC: Ferritin: 8 ng/ml — ABNORMAL LOW (ref 9–269)

## 2013-06-27 NOTE — Progress Notes (Signed)
[facility] OFFICE PROGRESS NOTE  [name],[name], MD DIAGNOSIS:  Iron deficiency anemia chronic leukopenia   SUMMARY OF HEMATOLOGIC HISTORY:  this is a patient was referred here 6 months ago for severe anemia. She was also noted to have chronic leukopenia. The patient received blood transfusion because of severe anemia. Because of the anemia was thought to be due to heavy menstruation. She was recommended to take oral iron supplement.  INTERVAL HISTORY: Brianna [name] 22 y.o. female returns for  persistent abnormal CBC. She's been compliant with taking oral iron supplement but she took 2 together in the morning. She denies any further pica.The patient denies any recent signs or symptoms of bleeding such as spontaneous epistaxis, hematuria or hematochezia. She does have heavy menstruation, were regular heavy bleeding for 5 days out of 28 days. Denies any dizziness, muscular cramping, shortness of breath on exertion or headaches. The patient denies any history of recurrent infection.  She denies any recent fever, chills, night sweats or abnormal weight loss  I have reviewed the past medical history, past surgical history, social history and family history with the patient and they are unchanged from previous note.  ALLERGIES:  has No Known Allergies.  MEDICATIONS:  Current Outpatient Prescriptions  Medication Sig Dispense Refill  . buPROPion (WELLBUTRIN SR) 150 MG 12 hr tablet Take 450 mg by mouth every morning.      . busPIRone (BUSPAR) 10 MG tablet Take 10 mg by mouth at bedtime.      . hydrOXYzine (VISTARIL) 25 MG capsule Take 25 mg by mouth 3 (three) times daily as needed for itching.        No current facility-administered medications for this visit.     REVIEW OF SYSTEMS:   Constitutional: Denies fevers, chills or night sweats Eyes: Denies blurriness of vision Ears, nose, mouth, throat, and face: Denies mucositis or sore throat Respiratory: Denies cough, dyspnea or  wheezes Cardiovascular: Denies palpitation, chest discomfort or lower extremity swelling Gastrointestinal:  Denies nausea, heartburn or change in bowel habits Skin: Denies abnormal skin rashes Lymphatics: Denies new lymphadenopathy or easy bruising Neurological:Denies numbness, tingling or new weaknesses Behavioral/Psych: Mood is stable, no new changes  All other systems were reviewed with the patient and are negative.  PHYSICAL EXAMINATION: ECOG PERFORMANCE STATUS: 0 - Asymptomatic  Filed Vitals:   06/27/13 1505  BP: 105/60  Pulse: 94  Temp: 97.6 F (36.4 C)  Resp: 18   Filed Weights   06/27/13 1505  Weight: 132 lb 12.8 oz (60.238 kg)    GENERAL:alert, no distress and comfortable SKIN: skin color, texture, turgor are normal, no rashes or significant lesions EYES: normal, Conjunctiva are pink and non-injected, sclera clear OROPHARYNX:no exudate, no erythema and lips, buccal mucosa, and tongue normal  NECK: supple, thyroid normal size, non-tender, without nodularity LYMPH:  no palpable lymphadenopathy in the cervical, axillary or inguinal LUNGS: clear to auscultation and percussion with normal breathing effort HEART: regular rate & rhythm and no murmurs and no lower extremity edema ABDOMEN:abdomen soft, non-tender and normal bowel sounds Musculoskeletal:no cyanosis of digits and no clubbing  NEURO: alert & oriented x 3 with fluent speech, no focal motor/sensory deficits  LABORATORY DATA:  I have reviewed the data as listed Results for orders placed in visit on 06/27/13 (from the past 48 hour(s))  CBC & DIFF AND RETIC     Status: Abnormal   Collection Time    06/27/13  2:52 PM      Result Value Range  WBC 3.2 (*) 3.9 - 10.3 10e3/uL   NEUT# 1.2 (*) 1.5 - 6.5 10e3/uL   HGB 12.5  11.6 - 15.9 g/dL   HCT 16.1  09.6 - 04.5 %   Platelets 252  145 - 400 10e3/uL   MCV 85.2  79.5 - 101.0 fL   MCH 28.0  25.1 - 34.0 pg   MCHC 32.9  31.5 - 36.0 g/dL   RBC 4.09  8.11 - 9.14  10e6/uL   RDW 14.0  11.2 - 14.5 %   lymph# 1.6  0.9 - 3.3 10e3/uL   MONO# 0.3  0.1 - 0.9 10e3/uL   Eosinophils Absolute 0.0  0.0 - 0.5 10e3/uL   Basophils Absolute 0.0  0.0 - 0.1 10e3/uL   NEUT% 38.6  38.4 - 76.8 %   LYMPH% 51.4 (*) 14.0 - 49.7 %   MONO% 8.5  0.0 - 14.0 %   EOS% 0.9  0.0 - 7.0 %   BASO% 0.6  0.0 - 2.0 %   Retic % 0.85  0.70 - 2.10 %   Retic Ct Abs 37.91  33.70 - 90.70 10e3/uL   Immature Retic Fract 10.30 (*) 1.60 - 10.00 %    ASSESSMENT:   #1 iron deficiency anemia #2 chronic leukopenia   PLAN:  #1 iron deficiency anemia Anemia has resolved. Her most recent iron studies suggest that she still iron deficient. The patient had heavy menstruation. I recommend she take oral iron supplements, one tablet twice a day instead of 2 tablet once a day. We'll continue followup on her iron study. I will like to keep her ferritin above 100 if possible. #2 chronic leukopenia This has not been worked up before. The total fluctuate up and down. She had a mild lymphocytosis seen on the most recent CBC. I am concerned about possible undiagnosed autoimmune disease or chronic viral infection. The patient is in agreement to proceed with further workup for this.   All questions were answered. The patient knows to call the clinic with any problems, questions or concerns. No barriers to learning was detected.  [facility], Masaki Rothbauer, MD 06/27/2013 4:17 PM

## 2013-06-27 NOTE — Telephone Encounter (Signed)
gv and printed appt sched and avs for pt for OCT and NVO

## 2013-06-28 ENCOUNTER — Other Ambulatory Visit (HOSPITAL_COMMUNITY)
Admission: RE | Admit: 2013-06-28 | Discharge: 2013-06-28 | Disposition: A | Discharge status: Home / Self Care | Payer: BC Managed Care – PPO | Source: Ambulatory Visit | Attending: Hematology and Oncology | Admitting: Hematology and Oncology

## 2013-06-28 ENCOUNTER — Other Ambulatory Visit (HOSPITAL_BASED_OUTPATIENT_CLINIC_OR_DEPARTMENT_OTHER): Payer: BC Managed Care – PPO | Admitting: Lab

## 2013-06-28 DIAGNOSIS — D72819 Decreased white blood cell count, unspecified: Secondary | ICD-10-CM | POA: Insufficient documentation

## 2013-06-28 DIAGNOSIS — D509 Iron deficiency anemia, unspecified: Secondary | ICD-10-CM | POA: Diagnosis not present

## 2013-06-28 LAB — HIV ANTIBODY (ROUTINE TESTING W REFLEX): HIV: NONREACTIVE

## 2013-06-28 LAB — RHEUMATOID FACTOR: Rhuematoid fact SerPl-aCnc: <10 IU/mL (ref <=14)

## 2013-07-04 ENCOUNTER — Other Ambulatory Visit: Payer: Self-pay

## 2013-07-18 ENCOUNTER — Telehealth: Payer: Self-pay | Admitting: Hematology and Oncology

## 2013-07-18 ENCOUNTER — Ambulatory Visit (HOSPITAL_BASED_OUTPATIENT_CLINIC_OR_DEPARTMENT_OTHER): Payer: BC Managed Care – PPO | Admitting: Hematology and Oncology

## 2013-07-18 VITALS — BP 99/54 | HR 71 | Temp 98.0°F | Resp 20 | Ht 67.5 in | Wt 132.1 lb

## 2013-07-18 DIAGNOSIS — D72819 Decreased white blood cell count, unspecified: Secondary | ICD-10-CM

## 2013-07-18 DIAGNOSIS — D509 Iron deficiency anemia, unspecified: Secondary | ICD-10-CM

## 2013-07-18 NOTE — Telephone Encounter (Signed)
gv adn printed appt sched and avs for pt for March 2015  °

## 2013-07-18 NOTE — Progress Notes (Signed)
[facility] OFFICE PROGRESS NOTE  [name],[name], MD DIAGNOSIS:  Chronic leukopenia, iron deficiency anemia  SUMMARY OF HEMATOLOGIC HISTORY: this is a patient was referred here 6 months ago for severe anemia. She was also noted to have chronic leukopenia. The patient received blood transfusion because of severe anemia. Because of the anemia was thought to be due to heavy menstruation. She was recommended to take oral iron supplement.  INTERVAL HISTORY: Brianna [name] 22 y.o. female returns for further followup. She is able to take 1 oral iron supplement twice a day after food. She is to have heavy menstruation but it is no different. She denies any signs and symptoms of infection. The patient denies any recent signs or symptoms of bleeding such as spontaneous epistaxis, hematuria or hematochezia. She denies any recent fever, chills, night sweats or abnormal weight loss  I have reviewed the past medical history, past surgical history, social history and family history with the patient and they are unchanged from previous note.  ALLERGIES:  has No Known Allergies.  MEDICATIONS:  Current Outpatient Prescriptions  Medication Sig Dispense Refill  . busPIRone (BUSPAR) 10 MG tablet Take 10 mg by mouth at bedtime.      . ferrous fumarate (HEMOCYTE - 106 MG FE) 325 (106 FE) MG TABS tablet Take 1 tablet by mouth 2 (two) times daily.      . hydrOXYzine (VISTARIL) 25 MG capsule Take 25 mg by mouth 3 (three) times daily as needed for itching.        No current facility-administered medications for this visit.     REVIEW OF SYSTEMS:   Constitutional: Denies fevers, chills or night sweats All other systems were reviewed with the patient and are negative.  PHYSICAL EXAMINATION: ECOG PERFORMANCE STATUS: 0 - Asymptomatic  Filed Vitals:   07/18/13 1310  BP: 99/54  Pulse: 71  Temp: 98 F (36.7 C)  Resp: 20   Filed Weights   07/18/13 1310  Weight: 132 lb 1.6 oz (59.92 kg)     GENERAL:alert, no distress and comfortable. She looked pale  NEURO: alert & oriented x 3 with fluent speech, no focal motor/sensory deficits  LABORATORY DATA:  I have reviewed the data as listed No results found for this or any previous visit (from the past 48 hour(s)).  Lab Results  Component Value Date   WBC 3.2* 06/27/2013   HGB 12.5 06/27/2013   HCT 38.0 06/27/2013   MCV 85.2 06/27/2013   PLT 252 06/27/2013   ASSESSMENT & PLAN:  #1 chronic leukopenia She is asymptomatic. All workup is negative. HIV infection is negative. It could be due to her African American heritage. We will observe. #2 iron deficiency anemia This is due to heavy menstruation. She is able to tolerate oral iron supplement twice a day. I will see her back in 4 months for repeat iron studies and we'll need to consider stopping it if her ferritin level is over 100.  All questions were answered. The patient knows to call the clinic with any problems, questions or concerns. No barriers to learning was detected.  I spent 15 minutes counseling the patient face to face. The total time spent in the appointment was 20 minutes and more than 50% was on counseling.     Elimelech Houseman, MD 07/18/2013 1:57 PM

## 2013-08-06 ENCOUNTER — Encounter (HOSPITAL_COMMUNITY): Payer: Self-pay | Admitting: Emergency Medicine

## 2013-08-06 ENCOUNTER — Emergency Department (HOSPITAL_COMMUNITY)
Admission: EM | Admit: 2013-08-06 | Discharge: 2013-08-06 | Disposition: A | Discharge status: Home / Self Care | Payer: BC Managed Care – PPO | Attending: Emergency Medicine | Admitting: Emergency Medicine

## 2013-08-06 DIAGNOSIS — X58XXXA Exposure to other specified factors, initial encounter: Secondary | ICD-10-CM | POA: Insufficient documentation

## 2013-08-06 DIAGNOSIS — Y939 Activity, unspecified: Secondary | ICD-10-CM | POA: Insufficient documentation

## 2013-08-06 DIAGNOSIS — D509 Iron deficiency anemia, unspecified: Secondary | ICD-10-CM | POA: Insufficient documentation

## 2013-08-06 DIAGNOSIS — Z8619 Personal history of other infectious and parasitic diseases: Secondary | ICD-10-CM | POA: Insufficient documentation

## 2013-08-06 DIAGNOSIS — S01312A Laceration without foreign body of left ear, initial encounter: Secondary | ICD-10-CM

## 2013-08-06 DIAGNOSIS — Y9239 Other specified sports and athletic area as the place of occurrence of the external cause: Secondary | ICD-10-CM | POA: Insufficient documentation

## 2013-08-06 DIAGNOSIS — Z79899 Other long term (current) drug therapy: Secondary | ICD-10-CM | POA: Insufficient documentation

## 2013-08-06 DIAGNOSIS — S01309A Unspecified open wound of unspecified ear, initial encounter: Secondary | ICD-10-CM | POA: Insufficient documentation

## 2013-08-06 NOTE — ED Notes (Signed)
Pt reports that she was at the gym and had her earphones in while wearing her stud earrings, pt reports the earphones pulled on the earring causing a 1.5cm lac to her L ear above her piercing.

## 2013-08-06 NOTE — ED Provider Notes (Signed)
CSN: [id number]     Arrival date & time 08/06/13  2107 History  This chart was scribed for Ivonne Andrew, PA, working with Shon Baton, MD, by Ardelia Mems ED Scribe. This patient was seen in room WTR6/WTR6 and the patient's care was started at 10:17 PM.   Chief Complaint  Patient presents with  . Ear Laceration    The history is provided by the patient. No language interpreter was used.    HPI Comments: Brianna [name] is a 22 y.o. female who presents to the Emergency Department complaining of a left ear laceration sustained about 2 hours ago. She states that she was at the gym wearing headphones and ear rings, which became tangled and caused her to sustain a laceration to her left ear above the piercing. Bleeding to the laceration is controlled. Pain is mild. She states that her Tetanus vaccinations are UTD. She states that she did not sustain any other injuries today.  She denies any other pain or symptoms.  PCP- Dr. Leilani Able   Past Medical History  Diagnosis Date  . Anemia     since puberty.   . Chlamydia infection 05/09/11  . Blood transfusion 03/12/11  . Iron deficiency anemia   . Leukopenia 06/27/2013   History reviewed. No pertinent past surgical history. Family History  Problem Relation Age of Onset  . Iron deficiency Mother   . Cancer Mother     sarcoma   History  Substance Use Topics  . Smoking status: Never Smoker   . Smokeless tobacco: Never Used  . Alcohol Use: 0.5 oz/week    1 drink(s) per week   OB History   Grav Para Term Preterm Abortions TAB SAB Ect Mult Living                 Review of Systems  HENT:       Left ear laceration.  All other systems reviewed and are negative.   Allergies  Review of patient's allergies indicates no known allergies.  Home Medications   Current Outpatient Rx  Name  Route  Sig  Dispense  Refill  . Biotin (BIOTIN 5000) 5 MG CAPS   Oral   Take 5 mg by mouth daily.         . ferrous fumarate (HEMOCYTE - 106 MG  FE) 325 (106 FE) MG TABS tablet   Oral   Take 1 tablet by mouth 2 (two) times daily.         . hydrOXYzine (VISTARIL) 25 MG capsule   Oral   Take 25 mg by mouth 3 (three) times daily as needed for itching.           Triage Vitals: BP 112/77  Pulse 108  Temp(Src) 98.6 F (37 C) (Oral)  Resp 16  SpO2 100%  LMP 08/03/2013  Physical Exam  Nursing note and vitals reviewed. Constitutional: She is oriented to person, place, and time. She appears well-developed and well-nourished. No distress.  HENT:  Head: Normocephalic and atraumatic.  Small laceration to the left earlobe. No deep structure or cartilage involvement  Eyes: EOM are normal.  Neck: Neck supple. No tracheal deviation present.  Cardiovascular: Normal rate.   Pulmonary/Chest: Effort normal. No respiratory distress.  Musculoskeletal: Normal range of motion.  Neurological: She is alert and oriented to person, place, and time.  Skin: Skin is warm and dry.  Psychiatric: She has a normal mood and affect. Her behavior is normal.    ED Course  Procedures  LACERATION REPAIR Performed by: Ivonne Andrew, PA Consent: Verbal consent obtained. Risks and benefits: risks, benefits and alternatives were discussed Patient identity confirmed: provided demographic data Time out performed prior to procedure Prepped and Draped in normal sterile fashion Wound explored Laceration Location: left ear Laceration Length: 1.5 cm No Foreign Bodies seen or palpated Anesthesia: local infiltration Local anesthetic: lidocaine 2% with epinephrine Anesthetic total: 2 ml Irrigation method: syringe Amount of cleaning: standard Skin closure: Skin with 7-0 Prolene  Number of sutures or staples: 4  Technique: Simple interrupted  Patient tolerance: Patient tolerated the procedure well with no immediate complications.  DIAGNOSTIC STUDIES: Oxygen Saturation is 100% on RA, normal by my interpretation.    COORDINATION OF CARE: 10:23 PM-  Discussed plan for laceration repair. Pt advised of plan for treatment and pt agrees.    MDM   1. Ear lobe laceration, left, initial encounter       I personally performed the services described in this documentation, which was scribed in my presence. The recorded information has been reviewed and is accurate.   Angus Seller, PA-C 08/06/13 2316

## 2013-08-07 NOTE — ED Provider Notes (Signed)
Medical screening examination/treatment/procedure(s) were performed by non-physician practitioner and as supervising physician I was immediately available for consultation/collaboration.  EKG Interpretation   None        Denya Buckingham F Jahzeel Poythress, MD 08/07/13 1304 

## 2013-10-01 ENCOUNTER — Telehealth: Payer: Self-pay | Admitting: Hematology and Oncology

## 2013-10-01 NOTE — Telephone Encounter (Signed)
Called pt and left message regarding appt , r/s from 3/20 to 3/19 lab and MD,mailed appt to pt

## 2013-10-28 ENCOUNTER — Other Ambulatory Visit: Payer: Self-pay | Admitting: Obstetrics and Gynecology

## 2013-10-28 DIAGNOSIS — D241 Benign neoplasm of right breast: Secondary | ICD-10-CM

## 2013-11-14 ENCOUNTER — Other Ambulatory Visit: Payer: BC Managed Care – PPO

## 2013-11-14 ENCOUNTER — Ambulatory Visit: Admission: RE | Admit: 2013-11-14 | Payer: BC Managed Care – PPO | Source: Ambulatory Visit

## 2013-11-14 ENCOUNTER — Telehealth: Payer: Self-pay | Admitting: Hematology and Oncology

## 2013-11-14 ENCOUNTER — Encounter: Payer: Self-pay | Admitting: Hematology and Oncology

## 2013-11-14 ENCOUNTER — Other Ambulatory Visit (HOSPITAL_BASED_OUTPATIENT_CLINIC_OR_DEPARTMENT_OTHER): Payer: BC Managed Care – PPO

## 2013-11-14 ENCOUNTER — Ambulatory Visit (HOSPITAL_BASED_OUTPATIENT_CLINIC_OR_DEPARTMENT_OTHER): Payer: BC Managed Care – PPO | Admitting: Hematology and Oncology

## 2013-11-14 VITALS — BP 118/74 | HR 78 | Temp 98.3°F | Resp 19 | Ht 67.5 in | Wt 123.5 lb

## 2013-11-14 DIAGNOSIS — N63 Unspecified lump in unspecified breast: Secondary | ICD-10-CM

## 2013-11-14 DIAGNOSIS — D509 Iron deficiency anemia, unspecified: Secondary | ICD-10-CM

## 2013-11-14 DIAGNOSIS — D72819 Decreased white blood cell count, unspecified: Secondary | ICD-10-CM

## 2013-11-14 LAB — CBC & DIFF AND RETIC
BASO%: 0.9 % (ref 0.0–2.0)
Basophils Absolute: 0 10*3/uL (ref 0.0–0.1)
EOS%: 1.4 % (ref 0.0–7.0)
Eosinophils Absolute: 0.1 10*3/uL (ref 0.0–0.5)
HEMATOCRIT: 35.4 % (ref 34.8–46.6)
HGB: 11.4 g/dL — ABNORMAL LOW (ref 11.6–15.9)
Immature Retic Fract: 7.2 % (ref 1.60–10.00)
LYMPH%: 55.1 % — AB (ref 14.0–49.7)
MCH: 26.3 pg (ref 25.1–34.0)
MCHC: 32.2 g/dL (ref 31.5–36.0)
MCV: 81.8 fL (ref 79.5–101.0)
MONO#: 0.5 10*3/uL (ref 0.1–0.9)
MONO%: 14.8 % — AB (ref 0.0–14.0)
NEUT#: 1 10*3/uL — ABNORMAL LOW (ref 1.5–6.5)
NEUT%: 27.8 % — AB (ref 38.4–76.8)
Platelets: 261 10*3/uL (ref 145–400)
RBC: 4.33 10*6/uL (ref 3.70–5.45)
RDW: 15.3 % — ABNORMAL HIGH (ref 11.2–14.5)
Retic %: 1 % (ref 0.70–2.10)
Retic Ct Abs: 43.3 10*3/uL (ref 33.70–90.70)
WBC: 3.5 10*3/uL — ABNORMAL LOW (ref 3.9–10.3)
lymph#: 1.9 10*3/uL (ref 0.9–3.3)

## 2013-11-14 LAB — IRON AND TIBC CHCC
%SAT: 10 % — AB (ref 21–57)
Iron: 39 ug/dL — ABNORMAL LOW (ref 41–142)
TIBC: 395 ug/dL (ref 236–444)
UIBC: 356 ug/dL (ref 120–384)

## 2013-11-14 LAB — CHCC SMEAR

## 2013-11-14 LAB — FERRITIN CHCC: Ferritin: 13 ng/ml (ref 9–269)

## 2013-11-14 NOTE — Telephone Encounter (Signed)
gv pt appt schedule for april including breast US @ [facility]. per pt request breast US scheduled for same day as f/u. per [facility] result will be available by the time pt sees NG.

## 2013-11-14 NOTE — Progress Notes (Signed)
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 She also have iron deficiency anemia secondary to menorrhagia and received intravenous iron infusion in the past. She had leukopenia over the past year being observed. INTERVAL HISTORY: Brianna [name] 23 y.o. female returns for further followup. She continue to take iron supplements daily. She had menorrhagia. The patient denies any recent signs or symptoms of bleeding such as spontaneous epistaxis, hematuria or hematochezia. She denies any recent infection. No new lymphadenopathy.  I have reviewed the past medical history, past surgical history, social history and family history with the patient and they are unchanged from previous note.  ALLERGIES:  has No Known Allergies.  MEDICATIONS:  Current Outpatient Prescriptions  Medication Sig Dispense Refill  . Biotin (BIOTIN 5000) 5 MG CAPS Take 5 mg by mouth daily.      . ferrous fumarate (HEMOCYTE - 106 MG FE) 325 (106 FE) MG TABS tablet Take 1 tablet by mouth 2 (two) times daily.      . traZODone (DESYREL) 50 MG tablet Take 50 mg by mouth at bedtime. 1/2 to 1 and 1/2 at bedtime      . Venlafaxine HCl 150 MG TB24 Take by mouth at bedtime.      . hydrOXYzine (VISTARIL) 25 MG capsule Take 25 mg by mouth 3 (three) times daily as needed for itching.        No current facility-administered medications for this visit.     REVIEW OF SYSTEMS:   Constitutional: Denies fevers, chills or night sweats Eyes: Denies blurriness of vision Ears, nose, mouth, throat, and face: Denies mucositis or sore throat Respiratory: Denies cough, dyspnea or wheezes Cardiovascular: Denies palpitation, chest discomfort or lower extremity swelling Gastrointestinal:  Denies nausea,  heartburn or change in bowel habits Skin: Denies abnormal skin rashes Lymphatics: Denies new lymphadenopathy or easy bruising Neurological:Denies numbness, tingling or new weaknesses Behavioral/Psych: Mood is stable, no new changes  All other systems were reviewed with the patient and are negative.  PHYSICAL EXAMINATION: ECOG PERFORMANCE STATUS: 0 - Asymptomatic  Filed Vitals:   11/14/13 1339  BP: 118/74  Pulse: 78  Temp: 98.3 F (36.8 C)  Resp: 19   Filed Weights   11/14/13 1339  Weight: 123 lb 8 oz (56.019 kg)    GENERAL:alert, no distress and comfortable SKIN: skin color, texture, turgor are normal, no rashes or significant lesions EYES: normal, Conjunctiva are pink and non-injected, sclera clear OROPHARYNX:no exudate, no erythema and lips, buccal mucosa, and tongue normal  NECK: supple, thyroid normal size, non-tender, without nodularity LYMPH:  no palpable lymphadenopathy in the cervical, axillary or inguinal LUNGS: clear to auscultation and percussion with normal breathing effort HEART: regular rate & rhythm and no murmurs and no lower extremity edema ABDOMEN:abdomen soft, non-tender and normal bowel sounds Musculoskeletal:no cyanosis of digits and no clubbing  NEURO: alert & oriented x 3 with fluent speech, no focal motor/sensory deficits On the right breast, there is a palpable nodule at the size of 1 inch, hard on palpation on the right lower quadrant of her right breast. LABORATORY DATA:  I have reviewed the data as listed Results for orders placed in visit on 11/14/13 (from the past 48 hour(s))  CBC & DIFF AND RETIC     Status: Abnormal  Collection Time    11/14/13  1:29 PM      Result Value Ref Range   WBC 3.5 (*) 3.9 - 10.3 10e3/uL   NEUT# 1.0 (*) 1.5 - 6.5 10e3/uL   HGB 11.4 (*) 11.6 - 15.9 g/dL   HCT 35.4  34.8 - 46.6 %   Platelets 261  145 - 400 10e3/uL   MCV 81.8  79.5 - 101.0 fL   MCH 26.3  25.1 - 34.0 pg   MCHC 32.2  31.5 - 36.0 g/dL   RBC 4.33   3.70 - 5.45 10e6/uL   RDW 15.3 (*) 11.2 - 14.5 %   lymph# 1.9  0.9 - 3.3 10e3/uL   MONO# 0.5  0.1 - 0.9 10e3/uL   Eosinophils Absolute 0.1  0.0 - 0.5 10e3/uL   Basophils Absolute 0.0  0.0 - 0.1 10e3/uL   NEUT% 27.8 (*) 38.4 - 76.8 %   LYMPH% 55.1 (*) 14.0 - 49.7 %   MONO% 14.8 (*) 0.0 - 14.0 %   EOS% 1.4  0.0 - 7.0 %   BASO% 0.9  0.0 - 2.0 %   Retic % 1.00  0.70 - 2.10 %   Retic Ct Abs 43.30  33.70 - 90.70 10e3/uL   Immature Retic Fract 7.20  1.60 - 10.00 %  [facility] SMEAR     Status: None   Collection Time    11/14/13  1:29 PM      Result Value Ref Range   Smear Result Smear Available      Lab Results  Component Value Date   WBC 3.5* 11/14/2013   HGB 11.4* 11/14/2013   HCT 35.4 11/14/2013   MCV 81.8 11/14/2013   PLT 261 11/14/2013   I reviewed her peripheral smear. There is absolute leukopenia with predominant lymphocytes seen. All the rest of her white blood cell morphology were normal. ASSESSMENT & PLAN:  #1 right breast mass And still concerned this might be malignant. I recommend repeat diagnostic ultrasound. She may need surgical consultation and biopsy to rule out malignancy. #2 anemia I suspect she may be iron deficient. Repeat iron studies confirmed significant iron deficiency. The patient has failed oral iron treatment. The most likely cause of her anemia is due to chronic blood loss. We discussed some of the risks, benefits, and alternatives of intravenous iron infusions. The patient is symptomatic from anemia and the iron level is critically low. She tolerated oral iron supplement poorly and desires to achieved higher levels of iron faster for adequate hematopoesis. Some of the side-effects to be expected including risks of infusion reactions, phlebitis, headaches, nausea and fatigue.  The patient is willing to proceed. Patient education material was dispensed.  Goal is to keep ferritin level greater than 50 #3 leukopenia The patient is asymptomatic. I do not have explanation  for this leukopenia. I recommend she come back in recheck her blood in 2 weeks. Recent B12 level was adequate. All questions were answered. The patient knows to call the clinic with any problems, questions or concerns. No barriers to learning was detected.  I spent 25 minutes counseling the patient face to face. The total time spent in the appointment was 40 minutes and more than 50% was on counseling.     [facility], [place], MD 11/14/2013 2:53 PM

## 2013-11-15 ENCOUNTER — Telehealth: Payer: Self-pay | Admitting: Hematology and Oncology

## 2013-11-15 ENCOUNTER — Ambulatory Visit: Payer: BC Managed Care – PPO | Admitting: Hematology and Oncology

## 2013-11-15 NOTE — Telephone Encounter (Signed)
, °

## 2013-11-19 ENCOUNTER — Ambulatory Visit (HOSPITAL_BASED_OUTPATIENT_CLINIC_OR_DEPARTMENT_OTHER): Payer: BC Managed Care – PPO

## 2013-11-19 VITALS — BP 109/70 | HR 69 | Temp 97.1°F | Resp 18

## 2013-11-19 DIAGNOSIS — D649 Anemia, unspecified: Secondary | ICD-10-CM

## 2013-11-19 DIAGNOSIS — D509 Iron deficiency anemia, unspecified: Secondary | ICD-10-CM

## 2013-11-19 MED ORDER — SODIUM CHLORIDE 0.9 % IV SOLN
1020.0000 mg | Freq: Once | INTRAVENOUS | Status: AC
  Administered 2013-11-19: 1020 mg via INTRAVENOUS
  Filled 2013-11-19: qty 34

## 2013-11-19 MED ORDER — DIPHENHYDRAMINE HCL 25 MG PO CAPS
ORAL_CAPSULE | ORAL | Status: AC
  Filled 2013-11-19: qty 1

## 2013-11-19 MED ORDER — ACETAMINOPHEN 325 MG PO TABS
650.0000 mg | ORAL_TABLET | Freq: Once | ORAL | Status: AC
  Administered 2013-11-19: 650 mg via ORAL

## 2013-11-19 MED ORDER — SODIUM CHLORIDE 0.9 % IV SOLN
Freq: Once | INTRAVENOUS | Status: AC
  Administered 2013-11-19: 14:00:00 via INTRAVENOUS

## 2013-11-19 MED ORDER — DIPHENHYDRAMINE HCL 25 MG PO CAPS
25.0000 mg | ORAL_CAPSULE | Freq: Once | ORAL | Status: AC
  Administered 2013-11-19: 25 mg via ORAL

## 2013-11-19 MED ORDER — ACETAMINOPHEN 325 MG PO TABS
ORAL_TABLET | ORAL | Status: AC
  Filled 2013-11-19: qty 2

## 2013-11-19 NOTE — Patient Instructions (Signed)

## 2013-11-28 ENCOUNTER — Other Ambulatory Visit: Payer: BC Managed Care – PPO

## 2013-11-28 ENCOUNTER — Ambulatory Visit
Admission: RE | Admit: 2013-11-28 | Discharge: 2013-11-28 | Disposition: A | Discharge status: Home / Self Care | Payer: BC Managed Care – PPO | Source: Ambulatory Visit | Attending: Hematology and Oncology | Admitting: Hematology and Oncology

## 2013-11-28 ENCOUNTER — Ambulatory Visit: Payer: BC Managed Care – PPO | Admitting: Hematology and Oncology

## 2013-11-28 DIAGNOSIS — N63 Unspecified lump in unspecified breast: Secondary | ICD-10-CM

## 2013-12-06 ENCOUNTER — Other Ambulatory Visit: Payer: Self-pay | Admitting: Hematology and Oncology

## 2013-12-06 ENCOUNTER — Other Ambulatory Visit (HOSPITAL_BASED_OUTPATIENT_CLINIC_OR_DEPARTMENT_OTHER): Payer: BC Managed Care – PPO

## 2013-12-06 ENCOUNTER — Ambulatory Visit (HOSPITAL_BASED_OUTPATIENT_CLINIC_OR_DEPARTMENT_OTHER): Payer: BC Managed Care – PPO | Admitting: Hematology and Oncology

## 2013-12-06 VITALS — BP 112/70 | HR 83 | Temp 98.3°F | Resp 20 | Ht 67.5 in | Wt 119.9 lb

## 2013-12-06 DIAGNOSIS — D509 Iron deficiency anemia, unspecified: Secondary | ICD-10-CM

## 2013-12-06 DIAGNOSIS — D72819 Decreased white blood cell count, unspecified: Secondary | ICD-10-CM

## 2013-12-06 DIAGNOSIS — N63 Unspecified lump in unspecified breast: Secondary | ICD-10-CM

## 2013-12-06 DIAGNOSIS — D649 Anemia, unspecified: Secondary | ICD-10-CM

## 2013-12-06 LAB — CHCC SMEAR

## 2013-12-06 LAB — CBC WITH DIFFERENTIAL/PLATELET
BASO%: 0.6 % (ref 0.0–2.0)
Basophils Absolute: 0 10*3/uL (ref 0.0–0.1)
EOS ABS: 0 10*3/uL (ref 0.0–0.5)
EOS%: 1 % (ref 0.0–7.0)
HCT: 39.2 % (ref 34.8–46.6)
HGB: 12.7 g/dL (ref 11.6–15.9)
LYMPH#: 1.9 10*3/uL (ref 0.9–3.3)
LYMPH%: 47.3 % (ref 14.0–49.7)
MCH: 27.8 pg (ref 25.1–34.0)
MCHC: 32.3 g/dL (ref 31.5–36.0)
MCV: 86 fL (ref 79.5–101.0)
MONO#: 0.4 10*3/uL (ref 0.1–0.9)
MONO%: 11.1 % (ref 0.0–14.0)
NEUT%: 40 % (ref 38.4–76.8)
NEUTROS ABS: 1.6 10*3/uL (ref 1.5–6.5)
Platelets: 246 10*3/uL (ref 145–400)
RBC: 4.55 10*6/uL (ref 3.70–5.45)
RDW: 20 % — ABNORMAL HIGH (ref 11.2–14.5)
WBC: 4.1 10*3/uL (ref 3.9–10.3)

## 2013-12-06 NOTE — Progress Notes (Signed)
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 She also have iron deficiency anemia secondary to menorrhagia and received intravenous iron infusio on 12/20/2012 and 12/20/2013. She had intermittent  leukopenia over the past year being observed.  INTERVAL HISTORY: Brianna [name] 23 y.o. female returns for further followup. She felt wonderful after her last iron infusion. She went for ultrasound of the right breast that came back normal. She felt fine  I have reviewed the past medical history, past surgical history, social history and family history with the patient and they are unchanged from previous note.  ALLERGIES:  has No Known Allergies.  MEDICATIONS:  Current Outpatient Prescriptions  Medication Sig Dispense Refill  . Biotin (BIOTIN 5000) 5 MG CAPS Take 5 mg by mouth daily.      . ferrous fumarate (HEMOCYTE - 106 MG FE) 325 (106 FE) MG TABS tablet Take 1 tablet by mouth 2 (two) times daily.      . hydrOXYzine (VISTARIL) 25 MG capsule Take 25 mg by mouth 3 (three) times daily as needed for itching.       . traZODone (DESYREL) 50 MG tablet Take 50 mg by mouth at bedtime. 1/2 to 1 and 1/2 at bedtime      . Venlafaxine HCl 150 MG TB24 Take by mouth at bedtime.       No current facility-administered medications for this visit.     REVIEW OF SYSTEMS:   Constitutional: Denies fevers, chills or night sweats Neurological:Denies numbness, tingling or new weaknesses Behavioral/Psych: Mood is stable, no new changes  All other systems were reviewed with the patient and are negative.  PHYSICAL EXAMINATION: ECOG PERFORMANCE STATUS: 0 - Asymptomatic  Filed Vitals:   12/06/13 1230  BP: 112/70  Pulse: 83  Temp: 98.3 F (36.8 C)  Resp: 20   Filed  Weights   12/06/13 1230  Weight: 119 lb 14.4 oz (54.386 kg)    GENERAL:alert, no distress and comfortable SKIN: skin color, texture, turgor are normal, no rashes or significant lesions Musculoskeletal:no cyanosis of digits and no clubbing  NEURO: alert & oriented x 3 with fluent speech, no focal motor/sensory deficits  LABORATORY DATA:  I have reviewed the data as listed Results for orders placed in visit on 12/06/13 (from the past 48 hour(s))  CBC WITH DIFFERENTIAL     Status: Abnormal   Collection Time    12/06/13 12:19 PM      Result Value Ref Range   WBC 4.1  3.9 - 10.3 10e3/uL   NEUT# 1.6  1.5 - 6.5 10e3/uL   HGB 12.7  11.6 - 15.9 g/dL   HCT 39.2  34.8 - 46.6 %   Platelets 246  145 - 400 10e3/uL   MCV 86.0  79.5 - 101.0 fL   MCH 27.8  25.1 - 34.0 pg   MCHC 32.3  31.5 - 36.0 g/dL   RBC 4.55  3.70 - 5.45 10e6/uL   RDW 20.0 (*) 11.2 - 14.5 %   lymph# 1.9  0.9 - 3.3 10e3/uL   MONO# 0.4  0.1 - 0.9 10e3/uL   Eosinophils Absolute 0.0  0.0 - 0.5 10e3/uL   Basophils Absolute 0.0  0.0 - 0.1 10e3/uL   NEUT% 40.0  38.4 - 76.8 %   LYMPH% 47.3  14.0 - 49.7 %   MONO% 11.1  0.0 - 14.0 %   EOS% 1.0  0.0 - 7.0 %   BASO% 0.6  0.0 - 2.0 %  [facility] SMEAR     Status: None   Collection Time    12/06/13 12:19 PM      Result Value Ref Range   Smear Result Smear Available      Lab Results  Component Value Date   WBC 4.1 12/06/2013   HGB 12.7 12/06/2013   HCT 39.2 12/06/2013   MCV 86.0 12/06/2013   PLT 246 12/06/2013   ASSESSMENT & PLAN:  #1 right breast mass Repeat diagnostic ultrasound was negative for malignancy. The patient is reassured #2 anemia This has resolved since iron infusion. I recommend seeing her back in 6 months with repeat blood work #3 leukopenia, resolved Likely benign in nature. Recommend observation only.  All questions were answered. The patient knows to call the clinic with any problems, questions or concerns. No barriers to learning was detected.  I spent 15  minutes counseling the patient face to face. The total time spent in the appointment was 20 minutes and more than 50% was on counseling.     Heath Lark, MD 12/06/2013 1:25 PM

## 2013-12-11 ENCOUNTER — Telehealth: Payer: Self-pay | Admitting: Hematology and Oncology

## 2013-12-11 NOTE — Telephone Encounter (Signed)
lmonvm for pt re appt for 10/9. schedule mailed.

## 2014-06-05 ENCOUNTER — Telehealth: Payer: Self-pay | Admitting: Hematology and Oncology

## 2014-06-05 NOTE — Telephone Encounter (Signed)
Pt called to r/s due to work, pt confirmed labs/ov ...Marland KitchenMarland KitchenMarland Kitchen KJ

## 2014-06-06 ENCOUNTER — Other Ambulatory Visit: Payer: BC Managed Care – PPO

## 2014-06-06 ENCOUNTER — Ambulatory Visit: Payer: BC Managed Care – PPO | Admitting: Hematology and Oncology

## 2014-06-23 ENCOUNTER — Other Ambulatory Visit: Payer: BC Managed Care – PPO

## 2014-06-23 ENCOUNTER — Ambulatory Visit: Payer: BC Managed Care – PPO | Admitting: Hematology and Oncology

## 2014-06-25 ENCOUNTER — Telehealth: Payer: Self-pay | Admitting: Hematology and Oncology

## 2014-06-25 NOTE — Telephone Encounter (Signed)
returned pt call and lvm with MD first available.Brianna KitchenMarland [name]

## 2014-07-03 ENCOUNTER — Ambulatory Visit: Payer: BC Managed Care – PPO | Admitting: Hematology and Oncology

## 2014-07-03 ENCOUNTER — Telehealth: Payer: Self-pay | Admitting: Hematology and Oncology

## 2014-07-03 ENCOUNTER — Other Ambulatory Visit: Payer: BC Managed Care – PPO

## 2014-07-03 NOTE — Telephone Encounter (Signed)
returned pt call and r/s missed appt...done...pt aware of new d.t °

## 2014-07-15 ENCOUNTER — Encounter: Payer: Self-pay | Admitting: Hematology and Oncology

## 2014-07-15 ENCOUNTER — Telehealth: Payer: Self-pay | Admitting: *Deleted

## 2014-07-15 ENCOUNTER — Ambulatory Visit (HOSPITAL_BASED_OUTPATIENT_CLINIC_OR_DEPARTMENT_OTHER): Payer: BC Managed Care – PPO | Admitting: Hematology and Oncology

## 2014-07-15 ENCOUNTER — Other Ambulatory Visit (HOSPITAL_BASED_OUTPATIENT_CLINIC_OR_DEPARTMENT_OTHER): Payer: BC Managed Care – PPO

## 2014-07-15 VITALS — BP 109/71 | HR 75 | Temp 98.2°F | Resp 19 | Ht 67.5 in | Wt 122.3 lb

## 2014-07-15 DIAGNOSIS — N63 Unspecified lump in unspecified breast: Secondary | ICD-10-CM

## 2014-07-15 DIAGNOSIS — D72819 Decreased white blood cell count, unspecified: Secondary | ICD-10-CM

## 2014-07-15 DIAGNOSIS — D509 Iron deficiency anemia, unspecified: Secondary | ICD-10-CM

## 2014-07-15 LAB — CBC & DIFF AND RETIC
BASO%: 0.5 % (ref 0.0–2.0)
Basophils Absolute: 0 10*3/uL (ref 0.0–0.1)
EOS%: 0.3 % (ref 0.0–7.0)
Eosinophils Absolute: 0 10*3/uL (ref 0.0–0.5)
HEMATOCRIT: 39.8 % (ref 34.8–46.6)
HGB: 13.4 g/dL (ref 11.6–15.9)
IMMATURE RETIC FRACT: 3.6 % (ref 1.60–10.00)
LYMPH#: 2.1 10*3/uL (ref 0.9–3.3)
LYMPH%: 54.5 % — ABNORMAL HIGH (ref 14.0–49.7)
MCH: 29 pg (ref 25.1–34.0)
MCHC: 33.7 g/dL (ref 31.5–36.0)
MCV: 86.1 fL (ref 79.5–101.0)
MONO#: 0.4 10*3/uL (ref 0.1–0.9)
MONO%: 11.2 % (ref 0.0–14.0)
NEUT#: 1.3 10*3/uL — ABNORMAL LOW (ref 1.5–6.5)
NEUT%: 33.5 % — AB (ref 38.4–76.8)
Platelets: 246 10*3/uL (ref 145–400)
RBC: 4.62 10*6/uL (ref 3.70–5.45)
RDW: 12.7 % (ref 11.2–14.5)
RETIC CT ABS: 40.66 10*3/uL (ref 33.70–90.70)
Retic %: 0.88 % (ref 0.70–2.10)
WBC: 3.8 10*3/uL — ABNORMAL LOW (ref 3.9–10.3)

## 2014-07-15 LAB — FERRITIN CHCC: Ferritin: 10 ng/ml (ref 9–269)

## 2014-07-15 NOTE — Assessment & Plan Note (Signed)
This is congenital in nature. She is not symptomatic. No further workup is needed.

## 2014-07-15 NOTE — Telephone Encounter (Signed)
Pt verbalized understanding but asks if she should stop her current iron pill.  Instructed her to finish her current supplements then take otc iron three times a week at bedtime until menopause.  She verbalized understanding.

## 2014-07-15 NOTE — Progress Notes (Signed)
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 She also have iron deficiency anemia secondary to menorrhagia and received intravenous iron infusion on 12/21/2012 and 11/19/2013. She had intermittent  leukopenia over the past year being observed. INTERVAL HISTORY: Merrell Rettinger 23 y.o. female returns for further follow-up. She denies changes in her breast mass. She denies menorrhagia. The patient denies any recent signs or symptoms of bleeding such as spontaneous epistaxis, hematuria or hematochezia.   I have reviewed the past medical history, past surgical history, social history and family history with the patient and they are unchanged from previous note.  ALLERGIES:  has No Known Allergies.  MEDICATIONS:  Current Outpatient Prescriptions  Medication Sig Dispense Refill  . Biotin (BIOTIN 5000) 5 MG CAPS Take 5 mg by mouth daily.    . ferrous fumarate (HEMOCYTE - 106 MG FE) 325 (106 FE) MG TABS tablet Take 1 tablet by mouth 2 (two) times daily.    . traZODone (DESYREL) 50 MG tablet Take 50 mg by mouth at bedtime. 1/2 to 1 and 1/2 at bedtime    . Venlafaxine HCl 150 MG TB24 Take by mouth at bedtime.     No current facility-administered medications for this visit.     REVIEW OF SYSTEMS:   Constitutional: Denies fevers, chills or night sweats Eyes: Denies blurriness of vision Ears, nose, mouth, throat, and face: Denies mucositis or sore throat Respiratory: Denies cough, dyspnea or wheezes Cardiovascular: Denies palpitation, chest discomfort or lower extremity swelling Gastrointestinal:  Denies nausea, heartburn or change in bowel habits Skin: Denies abnormal skin rashes Lymphatics: Denies new lymphadenopathy or easy bruising Neurological:Denies numbness,  tingling or new weaknesses Behavioral/Psych: Mood is stable, no new changes  All other systems were reviewed with the patient and are negative.  PHYSICAL EXAMINATION: ECOG PERFORMANCE STATUS: 0 - Asymptomatic  Filed Vitals:   07/15/14 1315  BP: 109/71  Pulse: 75  Temp: 98.2 F (36.8 C)  Resp: 19   Filed Weights   07/15/14 1315  Weight: 122 lb 4.8 oz (55.475 kg)    GENERAL:alert, no distress and comfortable SKIN: skin color, texture, turgor are normal, no rashes or significant lesions EYES: normal, Conjunctiva are pink and non-injected, sclera clear OROPHARYNX:no exudate, no erythema and lips, buccal mucosa, and tongue normal  NECK: supple, thyroid normal size, non-tender, without nodularity LYMPH:  no palpable lymphadenopathy in the cervical, axillary or inguinal LUNGS: clear to auscultation and percussion with normal breathing effort HEART: regular rate & rhythm and no murmurs and no lower extremity edema ABDOMEN:abdomen soft, non-tender and normal bowel sounds Musculoskeletal:no cyanosis of digits and no clubbing  NEURO: alert & oriented x 3 with fluent speech, no focal motor/sensory deficits Bilateral breast examination reveal bilateral lumps consistent with fibroadenoma. There were unchanged compared to prior exam. LABORATORY DATA:  I have reviewed the data as listed Results for orders placed or performed in visit on 07/15/14 (from the past 48 hour(s))  CBC & Diff and Retic     Status: Abnormal   Collection Time: 07/15/14 12:49 PM  Result Value Ref Range   WBC 3.8 (L) 3.9 - 10.3 10e3/uL   NEUT# 1.3 (L) 1.5 - 6.5 10e3/uL   HGB 13.4 11.6 - 15.9 g/dL   HCT 39.8 34.8 - 46.6 %   Platelets 246 145 -  400 10e3/uL   MCV 86.1 79.5 - 101.0 fL   MCH 29.0 25.1 - 34.0 pg   MCHC 33.7 31.5 - 36.0 g/dL   RBC 4.62 3.70 - 5.45 10e6/uL   RDW 12.7 11.2 - 14.5 %   lymph# 2.1 0.9 - 3.3 10e3/uL   MONO# 0.4 0.1 - 0.9 10e3/uL   Eosinophils Absolute 0.0 0.0 - 0.5 10e3/uL   Basophils  Absolute 0.0 0.0 - 0.1 10e3/uL   NEUT% 33.5 (L) 38.4 - 76.8 %   LYMPH% 54.5 (H) 14.0 - 49.7 %   MONO% 11.2 0.0 - 14.0 %   EOS% 0.3 0.0 - 7.0 %   BASO% 0.5 0.0 - 2.0 %   Retic % 0.88 0.70 - 2.10 %   Retic Ct Abs 40.66 33.70 - 90.70 10e3/uL   Immature Retic Fract 3.60 1.60 - 10.00 %  Ferritin     Status: None   Collection Time: 07/15/14 12:49 PM  Result Value Ref Range   Ferritin 10 9 - 269 ng/ml    Lab Results  Component Value Date   WBC 3.8* 07/15/2014   HGB 13.4 07/15/2014   HCT 39.8 07/15/2014   MCV 86.1 07/15/2014   PLT 246 07/15/2014   ASSESSMENT & PLAN:  Iron deficiency anemia Anemia has resolved although she remained mildly iron deficient. I recommend iron supplement 3 times a week indefinitely until she become menopause.  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 I reassured the patient.  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 She is not symptomatic. No further workup is needed.   All questions were answered. The patient knows to call the clinic with any problems, questions or concerns. No barriers to learning was detected.  I spent 15 minutes counseling the patient face to face. The total time spent in the appointment was 20 minutes and more than 50% was on counseling.     [facility], [place], MD 07/15/2014 4:07 PM

## 2014-07-15 NOTE — Telephone Encounter (Signed)
Pt called to state she was going to be late for her appts.  Called pt back and she said she would be here by 12:30 pm.

## 2014-07-15 NOTE — Telephone Encounter (Signed)
-----   Message from Heath Lark, MD sent at 07/15/2014  3:47 PM EST ----- Regarding: iron study Pls let her know even though she is not anemic, she has low iron. Recommend iron supplement OTC 3 times a week at bed time until she stops having period (menopause)  ----- Message -----    From: [facility]    Sent: 07/15/2014  12:58 PM      To: Heath Lark, MD

## 2014-07-15 NOTE — Assessment & Plan Note (Signed)
She has fibroadenoma with negative ultrasound. I reassured the patient.

## 2014-07-15 NOTE — Assessment & Plan Note (Signed)
Anemia has resolved although she remained mildly iron deficient. I recommend iron supplement 3 times a week indefinitely until she become menopause.

## 2014-08-05 ENCOUNTER — Other Ambulatory Visit: Payer: Self-pay | Admitting: Hematology and Oncology

## 2019-11-07 ENCOUNTER — Ambulatory Visit: Payer: Self-pay | Attending: Family

## 2019-11-07 DIAGNOSIS — Z23 Encounter for immunization: Secondary | ICD-10-CM

## 2019-11-07 NOTE — Progress Notes (Signed)
   Covid-19 Vaccination Clinic  Name:  Brianna [name]    MRN: [id number] DOB: August 06, 1991  11/07/2019  [name] was observed post Covid-19 immunization for 15 minutes without incident. She was provided with Vaccine Information Sheet and instruction to access the V-Safe system.   [name] was instructed to call 911 with any severe reactions post vaccine: Marland Kitchen Difficulty breathing  . Swelling of face and throat  . A fast heartbeat  . A bad rash all over body  . Dizziness and weakness   Immunizations Administered    Name Date Dose VIS Date Route   Moderna COVID-19 Vaccine 11/07/2019  1:19 PM 0.5 mL 07/30/2019 Intramuscular   Manufacturer: Moderna   Lot: [id number]   [id number]

## 2019-12-10 ENCOUNTER — Ambulatory Visit: Payer: 59 | Attending: Family

## 2019-12-10 DIAGNOSIS — Z23 Encounter for immunization: Secondary | ICD-10-CM

## 2019-12-10 NOTE — Progress Notes (Signed)
   Covid-19 Vaccination Clinic  Name:  Brianna [name]    MRN: [id number] DOB: 1991-06-21  12/10/2019  [name] was observed post Covid-19 immunization for 15 minutes without incident. She was provided with Vaccine Information Sheet and instruction to access the V-Safe system.   [name] was instructed to call 911 with any severe reactions post vaccine: Marland Kitchen Difficulty breathing  . Swelling of face and throat  . A fast heartbeat  . A bad rash all over body  . Dizziness and weakness   Immunizations Administered    Name Date Dose VIS Date Route   Moderna COVID-19 Vaccine 12/10/2019  4:44 PM 0.5 mL 07/30/2019 Intramuscular   Manufacturer: Moderna   Lot: [id number]   [id number]

## 2019-12-11 ENCOUNTER — Encounter (HOSPITAL_COMMUNITY): Payer: Self-pay

## 2019-12-11 ENCOUNTER — Other Ambulatory Visit: Payer: Self-pay

## 2019-12-11 ENCOUNTER — Ambulatory Visit (HOSPITAL_COMMUNITY)
Admission: EM | Admit: 2019-12-11 | Discharge: 2019-12-11 | Disposition: A | Discharge status: Home / Self Care | Payer: 59

## 2019-12-11 DIAGNOSIS — R0789 Other chest pain: Secondary | ICD-10-CM

## 2019-12-11 DIAGNOSIS — M940 Chondrocostal junction syndrome [Tietze]: Secondary | ICD-10-CM

## 2019-12-11 MED ORDER — TIZANIDINE HCL 4 MG PO TABS: 4.0000 mg | ORAL_TABLET | Freq: Every evening | ORAL | 0 refills | Status: AC | PRN

## 2019-12-11 MED ORDER — NAPROXEN 500 MG PO TABS: 500.0000 mg | ORAL_TABLET | Freq: Two times a day (BID) | ORAL | 0 refills | Status: AC

## 2019-12-11 NOTE — ED Provider Notes (Signed)
[place]   MRN: [id number] DOB: October 12, 1990  Subjective:   Brianna [name] is a 29 y.o. female presenting for acute onset of mid chest pain across her entire chest and worse over the sternum.  Pain is mild, mostly constant.  She is concerned about vaccine reaction given that she got her second dose of the Covid vaccine yesterday.  This is not the [employer] vaccine.  Denies fever, shortness of breath, nausea, vomiting, sweating, cough, rash.  Patient is an avid runner.  Otherwise denies trauma, falls, bruising, heavy lifting.  No current facility-administered medications for this encounter.  Current Outpatient Medications:  .  desogestrel-ethinyl estradiol (APRI) 0.15-30 MG-MCG tablet, Take by mouth., Disp: , Rfl:  .  Biotin (BIOTIN 5000) 5 MG CAPS, Take 5 mg by mouth daily., Disp: , Rfl:  .  ferrous fumarate (HEMOCYTE - 106 MG FE) 325 (106 FE) MG TABS tablet, Take 1 tablet by mouth 2 (two) times daily., Disp: , Rfl:  .  traZODone (DESYREL) 50 MG tablet, Take 50 mg by mouth at bedtime. 1/2 to 1 and 1/2 at bedtime, Disp: , Rfl:  .  Venlafaxine HCl 150 MG TB24, Take by mouth at bedtime., Disp: , Rfl:    No Known Allergies  Past Medical History:  Diagnosis Date  . Anemia    since puberty.   . Anxiety   . Blood transfusion 03/12/11  . Chlamydia infection 05/09/11  . Depression   . Iron deficiency anemia   . Leukopenia 06/27/2013     History reviewed. No pertinent surgical history.  Family History  Problem Relation Age of Onset  . Iron deficiency Mother   . Cancer Mother        sarcoma    Social History   Tobacco Use  . Smoking status: Never Smoker  . Smokeless tobacco: Never Used  Substance Use Topics  . Alcohol use: Yes    Alcohol/week: 1.0 standard drinks    Types: 1 Standard drinks or equivalent per week  . Drug use: No    ROS   Objective:   Vitals: BP 116/72 (BP Location: Right Arm)   Pulse 82   Temp 98.4 F (36.9 C) (Oral)   Resp 16   LMP  09/26/2019 Comment: on cycle pills  SpO2 98%   Physical Exam Constitutional:      General: She is not in acute distress.    Appearance: Normal appearance. She is well-developed. She is not ill-appearing, toxic-appearing or diaphoretic.  HENT:     Head: Normocephalic and atraumatic.     Nose: Nose normal.     Mouth/Throat:     Mouth: Mucous membranes are moist.  Eyes:     Extraocular Movements: Extraocular movements intact.     Pupils: Pupils are equal, round, and reactive to light.  Cardiovascular:     Rate and Rhythm: Normal rate and regular rhythm.     Pulses: Normal pulses.     Heart sounds: Normal heart sounds. No murmur. No friction rub. No gallop.   Pulmonary:     Effort: Pulmonary effort is normal. No respiratory distress.     Breath sounds: Normal breath sounds. No stridor. No wheezing, rhonchi or rales.  Skin:    General: Skin is warm and dry.     Findings: No rash.  Neurological:     Mental Status: She is alert and oriented to person, place, and time.  Psychiatric:        Mood and Affect: Mood normal.  Behavior: Behavior normal.        Thought Content: Thought content normal.     ED ECG REPORT   Date: 12/11/2019  Rate: 82bpm  Rhythm: normal sinus rhythm  QRS Axis: normal  Intervals: normal  ST/T Wave abnormalities: normal  Conduction Disutrbances:none  Narrative Interpretation: Sinus rhythm at 82 bpm.  Old EKG Reviewed: none available  I have personally reviewed the EKG tracing and agree with the computerized printout as noted.   Assessment and Plan :   PDMP not reviewed this encounter.  1. Chest wall pain   2. Costochondritis     Suspect costochondritis.  Will manage conservatively with naproxen, tizanidine and modification of physical activity including holding off on running for 1 to 2 weeks.  Addressed patient's concerns about chest PE given recent [facility] adverse effects.  Patient actually did not get the [facility]  vaccine and has very reassuring physical exam findings, vital signs, no shortness of breath, heart racing, cough or fever. Counseled patient on potential for adverse effects with medications prescribed/recommended today, ER and return-to-clinic precautions discussed, patient verbalized understanding.    Jaynee Eagles, PA-C 12/12/19 1150

## 2019-12-11 NOTE — ED Triage Notes (Signed)
Pt c/o mid-sternal CP onset today at approx 1600. States sharp pain radiate occasionally to right chest area.  Pt reports she received her 2nd dose COVID vaccine yesterday.  Denies SOB, n/v, diaphoresis.
